# Patient Record
Sex: Male | Born: 1940 | Race: White | Hispanic: No | Marital: Married | Smoking: Never smoker
Health system: Southern US, Community
[De-identification: ages and names within clinical notes are randomized; demographics above are authoritative.]

## PROBLEM LIST (undated history)

## (undated) DIAGNOSIS — E785 Hyperlipidemia, unspecified: Secondary | ICD-10-CM

## (undated) DIAGNOSIS — N289 Disorder of kidney and ureter, unspecified: Secondary | ICD-10-CM

## (undated) HISTORY — PX: APPENDECTOMY: SHX54

## (undated) HISTORY — DX: Hyperlipidemia, unspecified: E78.5

## (undated) HISTORY — PX: TONSILLECTOMY: SUR1361

---

## 2001-11-08 ENCOUNTER — Ambulatory Visit (HOSPITAL_COMMUNITY): Admission: RE | Admit: 2001-11-08 | Discharge: 2001-11-08 | Payer: Self-pay | Admitting: *Deleted

## 2005-09-05 ENCOUNTER — Ambulatory Visit (HOSPITAL_COMMUNITY): Admission: RE | Admit: 2005-09-05 | Discharge: 2005-09-05 | Payer: Self-pay | Admitting: Ophthalmology

## 2008-08-17 ENCOUNTER — Inpatient Hospital Stay (HOSPITAL_COMMUNITY): Admission: AC | Admit: 2008-08-17 | Discharge: 2008-08-18 | Payer: Self-pay

## 2010-11-14 LAB — CBC
HCT: 46.8 % (ref 39.0–52.0)
Hemoglobin: 15.6 g/dL (ref 13.0–17.0)
MCHC: 33.3 g/dL (ref 30.0–36.0)
WBC: 8.4 10*3/uL (ref 4.0–10.5)

## 2010-11-14 LAB — POCT I-STAT, CHEM 8
Glucose, Bld: 98 mg/dL (ref 70–99)
HCT: 48 % (ref 39.0–52.0)
TCO2: 27 mmol/L (ref 0–100)

## 2010-11-14 LAB — PROTIME-INR: Prothrombin Time: 12.8 seconds (ref 11.6–15.2)

## 2010-12-13 NOTE — H&P (Signed)
NAMEMATTIAS, WALMSLEY                ACCOUNT NO.:  [id number]   MEDICAL RECORD NO.:  [id number]          PATIENT TYPE:  INP   LOCATION:  5016                         FACILITY:  [facility]   PHYSICIAN:  Sandria Bales. Ezzard Standing, M.D.  DATE OF BIRTH:  10/28/1940   DATE OF ADMISSION:  08/17/2008  DATE OF DISCHARGE:                              HISTORY & PHYSICAL   Date of admission ?   HISTORY OF ILLNESS:  This is a 70 year old white male who is a patient  of  Dr. Merri Brunette, who was cutting a tree limb this evening around 5  or 5:30 PM when the tree limb sprung back and hit him on the left side  of his face and knocked him out.  He was brought in by ambulance to the  [facility] emergency room as a Gold trauma but on presentation, he had a  Glasgow coma scale of 15 and a revised trauma score of 12, was alert,  oriented, and talking and downgraded to a Silver post evaluation.   PAST MEDICAL HISTORY:  He has no allergies.  His only medication, he is  using some Prevacid for gastroesophageal reflux disease.   His prior operations include a loss of his fifth finger on his right  hand in 1976.  He is right handed.  He had appendectomy at age 3.  He  had tonsils at age 56.   REVIEW OF SYSTEMS:  NEUROLOGIC:  No prior history of seizure or loss of  consciousness.  PULMONARY:  He does not smoke cigarettes.  Denies pneumonia,  tuberculosis.  CARDIAC:  He has had no heart disease, chest pain, or hypertension.  GASTROINTESTINAL:  He does have a history of reflux.  He has never  undergone endoscopy.  No history of liver disease, pancreatic disease,  and colon disease.  GENITOURINARY:  No kidney stones or kidney infections.   PERSONAL HISTORY:  He works for the [employer], was off today.  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   PHYSICAL EXAMINATION:  VITAL SIGNS:  He has a pulse of 75, blood  pressure of 125/71, and  respirations 16.  GENERAL:  He is alert, oriented.  He is a well-nourished pleasant white  male.  HEENT:  He has a laceration along his left side of the face, which is  about 8 cm.  Below this, between the lateral canthus of his left eye and  left ear, he has a stellate laceration about 4 cm.  He has extraocular movement good x6.  His pupils equal, reactive.  He  has gross vision in both eyes but he says he had right eye injury when  he was younger.  This left him with some decreased vision in the right  eye.  The left eye, he says, is in good shape before this injury.  He  has a cut on the underside of his left tongue probably where he bit it  some, but no lip or tooth injury.  NECK:  He is in cervical collar but has no clear neck pain.  He has no  abrasion in the base of his left neck.  He has abrasion of his left  shoulder, which has got some tenderness.  LUNGS:  Clear to auscultation with symmetric breath sounds.  HEART:  Regular rate and rhythm without murmur or rub.  ABDOMEN:  Soft.  He has no tenderness, no guarding, no abdominal injury.  His pelvis is stable.  We rolled  him on his back.  It is unremarkable  for any point pain or discomfort.  EXTREMITIES:  He is missing the fifth digit on his right hand.  He has  an abrasion over his right hand, probably where he caught something, and  an abrasion of his left shoulder.  He has got good strength on his upper  and lower extremities with appropriate neurologic evaluation.   His chest x-ray was negative.  We obtained a CT scan of the head, his  neck, and his face.  These showed no obvious fracture or intracranial  injury with no obvious facial fractures.   IMPRESSION:  1. Closed head injury with loss of consciousness.  He lives in [place],      which is at least some 30-40 minutes away, so I will plan to keep      him overnight for observation.  2. He has a 2 lacerations on the left side on his face approximately 8      cm and a  4 cm stellate laceration.  I will irrigate and clean this      in the emergency room.  3. History of gastroesophageal reflux disease.      Sandria Bales. Ezzard Standing, M.D.  Electronically Signed     DHN/MEDQ  D:  08/17/2008  T:  08/18/2008  Job:  [id number]   cc:   Soyla Murphy. Renne Crigler, M.D.

## 2010-12-13 NOTE — Op Note (Signed)
NAMEBEVERLEY, SHERRARD                ACCOUNT NO.:  [id number]   MEDICAL RECORD NO.:  [id number]          PATIENT TYPE:  EMS   LOCATION:  MAJO                         FACILITY:  [facility]   PHYSICIAN:  Sandria Bales. Ezzard Standing, M.D.  DATE OF BIRTH:  1940-10-28   DATE OF PROCEDURE:  DATE OF DISCHARGE:                               OPERATIVE REPORT   Date of surgery ?   PREOPERATIVE DIAGNOSES:  Two lacerations left temporal and cheek bone of  8 cm and a 4 cm left stylette laceration.   POSTOPERATIVE DIAGNOSES:  Laceration of left temporal cheek bone of 8 cm  and 4 cm stylette laceration.   PROCEDURE:  Closure of 2 lacerations with subcutaneous sutures and skin  sutures.   SURGEON:  Dr. Ezzard Standing.   ANESTHESIA:  Approximately 20 mL, 1% Xylocaine with epinephrine.   COMPLICATIONS:  None.   PROCEDURE:  Mr. Restivo is a 70 year old white male who was cutting a  limb.  The limb snapped back and hit him in the left temporal area and  knocked him out.  He was brought into the hospital as a Gold Trauma, but  then downgraded.  He has a significant laceration on the left side of  his face and left cheek.  From a neurologic standpoint, he has good  extraocular movements of the left eye.  He can close his eye well.  He  has a little bit of numbness associated around the edges of the  laceration and a CT scan of his face showed no facial fractures.   OPERATION:  I cleaned the area with Betadine.  First, I infiltrated about a 20 mL of  1% Xylocaine.  I palpated along the forehead temple area, there was no  bony fracture, I irrigated with a saline.  I saw very little debris in  the wound.  I cut back some of the dead skin.  I then closed it in 2  layers of the deep layer of 4-0 Vicryl suture and a skin layer of  interrupted 5-0 nylon sutures.   We washed with soap and water, painted with neosporin ointment.  The  patient tolerated the procedure well.  Will be observed overnight and  plan to discharge him  tomorrow morning.      Sandria Bales. Ezzard Standing, M.D.  Electronically Signed     DHN/MEDQ  D:  08/17/2008  T:  08/18/2008  Job:  [id number]

## 2012-12-12 ENCOUNTER — Other Ambulatory Visit: Payer: Self-pay | Admitting: Gastroenterology

## 2013-05-08 ENCOUNTER — Emergency Department (HOSPITAL_BASED_OUTPATIENT_CLINIC_OR_DEPARTMENT_OTHER)
Admission: EM | Admit: 2013-05-08 | Discharge: 2013-05-08 | Disposition: A | Discharge status: Home / Self Care | Payer: Worker's Compensation | Attending: Emergency Medicine | Admitting: Emergency Medicine

## 2013-05-08 ENCOUNTER — Encounter (HOSPITAL_BASED_OUTPATIENT_CLINIC_OR_DEPARTMENT_OTHER): Payer: Self-pay | Admitting: Emergency Medicine

## 2013-05-08 DIAGNOSIS — K4090 Unilateral inguinal hernia, without obstruction or gangrene, not specified as recurrent: Secondary | ICD-10-CM | POA: Insufficient documentation

## 2013-05-08 DIAGNOSIS — Z87448 Personal history of other diseases of urinary system: Secondary | ICD-10-CM | POA: Insufficient documentation

## 2013-05-08 HISTORY — DX: Disorder of kidney and ureter, unspecified: N28.9

## 2013-05-08 NOTE — ED Provider Notes (Signed)
CSN: [id number]     Arrival date & time 05/08/13  1551 History   First MD Initiated Contact with Patient 05/08/13 1653     Chief Complaint  Patient presents with  . Groin Injury   (Consider location/radiation/quality/duration/timing/severity/associated sxs/prior Treatment) HPI Comments: Pt presents with 2 days og R inguinal swelling he noticed while showering in setting of doing lots of pulling of elastic belts at work.  Mild pain over site, but non currently. Swelling worse w/ cough & standing. No scrotal pain/swelling. No hx of same. N n/v, d/a, fever, chills.   The history is provided by the patient. No language interpreter was used.    Past Medical History  Diagnosis Date  . Renal disorder    Past Surgical History  Procedure Laterality Date  . Tonsillectomy    . Appendectomy     No family history on file. History  Substance Use Topics  . Smoking status: Never Smoker   . Smokeless tobacco: Not on file  . Alcohol Use: No    Review of Systems  Constitutional: Negative for fever, activity change, appetite change and fatigue.  HENT: Negative for congestion, facial swelling, rhinorrhea and trouble swallowing.   Eyes: Negative for photophobia and pain.  Respiratory: Negative for cough, chest tightness and shortness of breath.   Cardiovascular: Negative for chest pain and leg swelling.  Gastrointestinal: Negative for nausea, vomiting, abdominal pain, diarrhea and constipation.  Endocrine: Negative for polydipsia and polyuria.  Genitourinary: Negative for dysuria, urgency, decreased urine volume and difficulty urinating.  Musculoskeletal: Negative for back pain and gait problem.  Skin: Negative for color change, rash and wound.  Allergic/Immunologic: Negative for immunocompromised state.  Neurological: Negative for dizziness, facial asymmetry, speech difficulty, weakness, numbness and headaches.  Psychiatric/Behavioral: Negative for confusion, decreased concentration and  agitation.    Allergies  Review of patient's allergies indicates no known allergies.  Home Medications   Current Outpatient Rx  Name  Route  Sig  Dispense  Refill  . tamsulosin (FLOMAX) 0.4 MG CAPS capsule   Oral   Take 0.4 mg by mouth.          BP 107/65  Pulse 66  Temp(Src) 98.7 F (37.1 C) (Oral)  Resp 18  SpO2 99% Physical Exam  Constitutional: He is oriented to person, place, and time. He appears well-developed and well-nourished. No distress.  HENT:  Head: Normocephalic and atraumatic.  Mouth/Throat: No oropharyngeal exudate.  Eyes: Pupils are equal, round, and reactive to light.  Neck: Normal range of motion. Neck supple.  Cardiovascular: Normal rate, regular rhythm and normal heart sounds.  Exam reveals no gallop and no friction rub.   No murmur heard. Pulmonary/Chest: Effort normal and breath sounds normal. No respiratory distress. He has no wheezes. He has no rales.  Abdominal: Soft. Bowel sounds are normal. He exhibits no distension and no mass. There is no tenderness. There is no rebound and no guarding. A hernia is present. Hernia confirmed positive in the right inguinal area. Hernia confirmed negative in the left inguinal area.  Genitourinary: Testes normal. Cremasteric reflex is present. Right testis shows no mass, no swelling and no tenderness. Left testis shows no mass, no swelling and no tenderness.  Musculoskeletal: Normal range of motion. He exhibits no edema and no tenderness.  Lymphadenopathy:       Right: No inguinal adenopathy present.       Left: No inguinal adenopathy present.  Neurological: He is alert and oriented to person, place, and time.  Skin:  Skin is warm and dry.  Psychiatric: He has a normal mood and affect.    ED Course  Procedures (including critical care time) Labs Review Labs Reviewed - No data to display Imaging Review No results found.  EKG Interpretation   None       MDM   1. Indirect inguinal hernia    Pt is a 72  y.o. male with Pmhx as above who presents with 3 days of R inguinal swelling worse w/ cough & standing that he found while showering. Exam c/w indirect inquinal hernia.  Scrotal/testicular exam otherwise benign.  No ttp over hernia, and doubt strangulation.  Will have him f/u with PCP tomorrow and have given referral to [facility] [place] [facility]. Return precautions given for new or worsening symptoms including persisent pain/swelling not improved by pressure or laying down.          Shanna Cisco, MD 05/08/13 1743

## 2013-05-08 NOTE — ED Notes (Signed)
Right groin injury. Stretching a belt 3 days ago. He felt pain the next morning. Workmans comp.

## 2013-06-03 ENCOUNTER — Telehealth (HOSPITAL_BASED_OUTPATIENT_CLINIC_OR_DEPARTMENT_OTHER): Payer: Self-pay | Admitting: *Deleted

## 2013-06-03 NOTE — ED Notes (Signed)
Daniel Dunn called and requested a referral to [name].  Chart reviewed with Daniel Cobb, Daniel Dunn.  Call placed to Daniel Dunn and left message with referral on referral line. Message left for Daniel Dunn to call back if he needs additional information.

## 2013-06-05 NOTE — ED Notes (Signed)
Mr. Mcnay called back with correct fax # for [facility].  Faxed order for referral and referral information.  Fax :[phone] Chart reviewed with K. Sophia, PAC

## 2019-08-19 ENCOUNTER — Ambulatory Visit: Payer: POS | Attending: Internal Medicine

## 2019-08-19 DIAGNOSIS — Z20822 Contact with and (suspected) exposure to covid-19: Secondary | ICD-10-CM | POA: Insufficient documentation

## 2019-08-20 LAB — NOVEL CORONAVIRUS, NAA: SARS-CoV-2, NAA: NOT DETECTED

## 2019-08-22 ENCOUNTER — Ambulatory Visit: Payer: Self-pay | Attending: Internal Medicine

## 2019-08-22 DIAGNOSIS — Z23 Encounter for immunization: Secondary | ICD-10-CM | POA: Insufficient documentation

## 2019-08-22 NOTE — Progress Notes (Signed)
   Covid-19 Vaccination Clinic  Name:  Antwine Agosto    MRN: [id number] DOB: September 07, 1940  08/22/2019  Mr. Huster was observed post Covid-19 immunization for 15 minutes without incidence. He was provided with Vaccine Information Sheet and instruction to access the V-Safe system.   Mr. Wojcicki was instructed to call 911 with any severe reactions post vaccine: Marland Kitchen Difficulty breathing  . Swelling of your face and throat  . A fast heartbeat  . A bad rash all over your body  . Dizziness and weakness    Immunizations Administered    Name Date Dose VIS Date Route   Pfizer COVID-19 Vaccine 08/22/2019  4:18 PM 0.3 mL 07/11/2019 Intramuscular   Manufacturer: [employer], [employer]   Lot: [id number]   NDC: [id number]

## 2019-09-12 ENCOUNTER — Ambulatory Visit: Payer: POS | Attending: Internal Medicine

## 2019-09-12 DIAGNOSIS — Z23 Encounter for immunization: Secondary | ICD-10-CM

## 2019-09-12 NOTE — Progress Notes (Signed)
   Covid-19 Vaccination Clinic  Name:  SANG BLOUNT    MRN: [id number] DOB: [birth year]  09/12/2019  Mr. Wimberly was observed post Covid-19 immunization for 15 minutes without incidence. He was provided with Vaccine Information Sheet and instruction to access the V-Safe system.   Mr. Bekele was instructed to call 911 with any severe reactions post vaccine: Marland Kitchen Difficulty breathing  . Swelling of your face and throat  . A fast heartbeat  . A bad rash all over your body  . Dizziness and weakness    Immunizations Administered    Name Date Dose VIS Date Route   Pfizer COVID-19 Vaccine 09/12/2019  3:26 PM 0.3 mL 07/11/2019 Intramuscular   Manufacturer: [employer], [employer]   Lot: [id number]   NDC: [address]-6

## 2020-04-15 ENCOUNTER — Other Ambulatory Visit: Payer: Self-pay | Admitting: Internal Medicine

## 2020-04-15 DIAGNOSIS — R911 Solitary pulmonary nodule: Secondary | ICD-10-CM

## 2020-04-26 ENCOUNTER — Other Ambulatory Visit: Payer: POS

## 2020-04-26 ENCOUNTER — Ambulatory Visit
Admission: RE | Admit: 2020-04-26 | Discharge: 2020-04-26 | Disposition: A | Discharge status: Home / Self Care | Payer: POS | Source: Ambulatory Visit | Attending: Internal Medicine | Admitting: Internal Medicine

## 2020-04-26 DIAGNOSIS — R911 Solitary pulmonary nodule: Secondary | ICD-10-CM

## 2020-06-02 ENCOUNTER — Other Ambulatory Visit: Payer: Self-pay | Admitting: Internal Medicine

## 2020-06-02 DIAGNOSIS — J189 Pneumonia, unspecified organism: Secondary | ICD-10-CM

## 2020-07-12 ENCOUNTER — Ambulatory Visit
Admission: RE | Admit: 2020-07-12 | Discharge: 2020-07-12 | Disposition: A | Discharge status: Home / Self Care | Payer: POS | Source: Ambulatory Visit | Attending: Internal Medicine | Admitting: Internal Medicine

## 2020-07-12 DIAGNOSIS — J189 Pneumonia, unspecified organism: Secondary | ICD-10-CM

## 2020-08-09 ENCOUNTER — Encounter: Payer: Self-pay | Admitting: Emergency Medicine

## 2020-08-09 ENCOUNTER — Ambulatory Visit (INDEPENDENT_AMBULATORY_CARE_PROVIDER_SITE_OTHER): Payer: POS | Admitting: Emergency Medicine

## 2020-08-09 ENCOUNTER — Other Ambulatory Visit: Payer: Self-pay

## 2020-08-09 VITALS — BP 120/60 | HR 93 | Temp 97.4°F | Ht 71.5 in | Wt 193.6 lb

## 2020-08-09 DIAGNOSIS — R053 Chronic cough: Secondary | ICD-10-CM | POA: Insufficient documentation

## 2020-08-09 DIAGNOSIS — R9389 Abnormal findings on diagnostic imaging of other specified body structures: Secondary | ICD-10-CM

## 2020-08-09 DIAGNOSIS — R911 Solitary pulmonary nodule: Secondary | ICD-10-CM | POA: Diagnosis not present

## 2020-08-09 HISTORY — DX: Chronic cough: R05.3

## 2020-08-09 HISTORY — DX: Abnormal findings on diagnostic imaging of other specified body structures: R93.89

## 2020-08-09 NOTE — Patient Instructions (Signed)
Please continue cetirizine (Zyrtec) once daily. Restart your fluticasone nasal spray (Flonase) 2 sprays each nostril once daily. We will plan to repeat your CT scan of the chest in March 2023 to follow for interval stability. Review with Dr. Renne Crigler whether you have received the pneumonia vaccine in the past Please notify our office if you have any recurrence of coughing, any shortness of breath or other respiratory symptoms.  If so we will see you sooner. Follow with Dr. Delton Coombes in March 2023 after your CT scan or sooner if you have any problems.

## 2020-08-09 NOTE — Progress Notes (Signed)
Subjective:    Patient ID: Daniel Dunn, male    DOB: [birth year], 80 y.o.   MRN: [id number]  HPI 80 year old never smoker with a history of hyperlipidemia, colonic polyps, allergic rhinitis, vitamin D deficiency, aortic atherosclerosis.  He was experiencing sore throat, cough with mucous Spring '21 through December. Then CT abd done for some hematuria early September, showed possible nodular disease so prompted CT chest as below. The CT chest questioned PNA, so he received azithro >> cough and sore throat improved. But he is also on benzonatate. He has PND, no GERD. Has zyrtec, and flonase, doesn;t use reliably.   CT scan of the chest 04/26/2020 reviewed by me, shows subcentimeter mediastinal nodes, nonpathologic, some apical bilateral scarring some posterior right upper lobe areas of nodularity, 4 x 3 mm nodular opacity posterior segment left upper lobe, 5 x 3 mm area of nodularity in the lingula with some subtle tree-in-bud type basilar opacities with some associated atelectasis  CT scan of the chest 07/12/2020 reviewed by me shows interval development of subtle tree-in-bud anterior lateral lingular opacity, minimal residual right basilar groundglass (improved), stable posterior right upper lobe nodular opacities, left upper lobe opacity.   Review of Systems As per HPI   Past Medical History:  Diagnosis Date  . Renal disorder   Hyperlipidemia Adenomatous polyps of the colon Allergic rhinitis, vitamin D deficiency Aortic atherosclerosis   No family history on file.   Social History   Socioeconomic History  . Marital status: Married    Spouse name: Not on file  . Number of children: Not on file  . Years of education: Not on file  . Highest education level: Not on file  Occupational History  . Not on file  Tobacco Use  . Smoking status: Never Smoker  . Smokeless tobacco: Never Used  Substance and Sexual Activity  . Alcohol use: No  . Drug use: No  . Sexual activity: Not  on file  Other Topics Concern  . Not on file  Social History Narrative  . 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  Was in [place], some agent orange.  Has lived in [place], [place], [place] Worked for [employer]  No Known Allergies   Outpatient Medications Prior to Visit  Medication Sig Dispense Refill  . aspirin 81 MG EC tablet 1 tablet    . atorvastatin (LIPITOR) 20 MG tablet Take 20 mg by mouth daily.    Marland Kitchen augmented betamethasone dipropionate (DIPROLENE-AF) 0.05 % cream Apply topically.    . benzonatate (TESSALON) 100 MG capsule Take 100 mg by mouth 3 (three) times daily.    . cetirizine (ZYRTEC ALLERGY) 10 MG tablet Take 10 mg by mouth daily.    . Cholecalciferol (VITAMIN D3) 50 MCG (2000 UT) CAPS Take 2 capsules by mouth daily.    Marland Kitchen ibuprofen (ADVIL) 600 MG tablet Take 600 mg by mouth 3 (three) times daily.    Marland Kitchen ketoconazole (NIZORAL) 2 % cream SMARTSIG:1 Application Topical 1 to 2 Times Daily    . magnesium oxide (MAG-OX) 400 MG tablet Take 400 mg by mouth daily.    . tamsulosin (FLOMAX) 0.4 MG CAPS capsule  Take 0.4 mg by mouth.     No facility-administered medications prior to visit.         Objective:   Physical Exam Vitals:   08/09/20 1514  BP: 120/60  Pulse: 93  Temp: (!) 97.4 F (36.3 C)  TempSrc: Temporal  SpO2: 96%  Weight: 193 lb 9.6 oz (87.8 kg)  Height: 5' 11.5" (1.816 m)   Gen: Pleasant, well-nourished, in no distress,  normal affect  ENT: No lesions,  mouth clear,  oropharynx clear, no postnasal drip  Neck: No JVD, no stridor  Lungs: No use of accessory muscles, no crackles or wheezing on normal respiration, no wheeze on forced expiration  Cardiovascular: RRR, heart  sounds normal, no murmur or gallops, no peripheral edema  Musculoskeletal: No deformities, no cyanosis or clubbing  Neuro: alert, awake, non focal  Skin: Warm, no lesions or rash      Assessment & Plan:  Abnormal CT of the chest CT chest was prompted by some subtle micronodular groundglass change on CT abdomen in September.  There was some lingular tree-in-bud opacity with some scattered more visible nodular disease bilaterally, etiology unclear.  No evidence for overt consolidation or community-acquired pneumonia, question atypical infection.  He was treated with azithromycin and his cough did get better although unclear whether the cough and the CT findings correlate.  His repeat CT in December shows migratory tree-in-bud inflammatory change in the lingula, improvement in his right basilar groundglass change and stable small nodules.  The groundglass changes could reflect a bronchiolitis or atypical mycobacterial infection.  If he develops recurrent cough, symptoms then it would be worthwhile to repeat his imaging, look for evidence of micronodular disease and consider bronchoscopy for culture data.  Absent any recurrent cough then I would repeat his CT at the 42-month mark which would be March 2023 to follow the tree-in-bud abnormalities and also the nodules for stability.  He is a low risk patient, never smoker.  Chronic cough Question whether this was related to postnasal drip, he denies any overt GERD symptoms.  Interestingly he did start to improve in December after he was treated with azithromycin but notes that his nasal congestion was improving at the same time and he is also on benzonatate.  I think he needs more consistent treatment of allergic rhinitis.  He will start taking his cetirizine more reliably, start using his fluticasone nasal spray.  If his cough persist then we could consider empiric treatment of esophageal reflux   Levy Pupa, MD, PhD 08/09/2020, 5:33 PM [place]  [facility] [phone] or if no answer [phone]

## 2020-08-09 NOTE — Assessment & Plan Note (Signed)
CT chest was prompted by some subtle micronodular groundglass change on CT abdomen in September.  There was some lingular tree-in-bud opacity with some scattered more visible nodular disease bilaterally, etiology unclear.  No evidence for overt consolidation or community-acquired pneumonia, question atypical infection.  He was treated with azithromycin and his cough did get better although unclear whether the cough and the CT findings correlate.  His repeat CT in December shows migratory tree-in-bud inflammatory change in the lingula, improvement in his right basilar groundglass change and stable small nodules.  The groundglass changes could reflect a bronchiolitis or atypical mycobacterial infection.  If he develops recurrent cough, symptoms then it would be worthwhile to repeat his imaging, look for evidence of micronodular disease and consider bronchoscopy for culture data.  Absent any recurrent cough then I would repeat his CT at the 109-month mark which would be March 2023 to follow the tree-in-bud abnormalities and also the nodules for stability.  He is a low risk patient, never smoker.

## 2020-08-09 NOTE — Assessment & Plan Note (Signed)
Question whether this was related to postnasal drip, he denies any overt GERD symptoms.  Interestingly he did start to improve in December after he was treated with azithromycin but notes that his nasal congestion was improving at the same time and he is also on benzonatate.  I think he needs more consistent treatment of allergic rhinitis.  He will start taking his cetirizine more reliably, start using his fluticasone nasal spray.  If his cough persist then we could consider empiric treatment of esophageal reflux

## 2021-03-17 ENCOUNTER — Telehealth: Payer: Self-pay | Admitting: Emergency Medicine

## 2021-03-18 NOTE — Telephone Encounter (Signed)
Ct is not due until March 2023 per Dr. Kavin Leech note from 07/2020

## 2021-03-18 NOTE — Telephone Encounter (Signed)
Spoke to wife she is aware ct and rov is to be done in 3/23 and we will call to set this up Tobe Sos

## 2021-04-06 ENCOUNTER — Telehealth: Payer: Self-pay | Admitting: Emergency Medicine

## 2021-04-06 MED ORDER — AZITHROMYCIN 250 MG PO TABS: 250.0000 mg | ORAL_TABLET | ORAL | 0 refills | Status: DC

## 2021-04-06 MED ORDER — BENZONATATE 100 MG PO CAPS: 100.0000 mg | ORAL_CAPSULE | Freq: Three times a day (TID) | ORAL | 0 refills | Status: DC | PRN

## 2021-04-06 NOTE — Telephone Encounter (Signed)
ATC, left VM. 

## 2021-04-06 NOTE — Telephone Encounter (Signed)
Spoke with the pt and notified of recs per RB  Rxs sent and nothing further needed

## 2021-04-06 NOTE — Telephone Encounter (Signed)
Called and spoke with patient's wife who states that patients cough returned in July/August and that patient has a lot of mucus draining down back of throat. She states patient had Covid August 12th. Patient is taking Zyrtec in the morning and Benedryl at night to help dry it up. Patient's cough is sometimes productive with clear sputum. Denies fever.   Dr. Delton Coombes please advise

## 2021-04-06 NOTE — Telephone Encounter (Signed)
If he is not currently taking the fluticasone nasal spray can have him get back on this.  Continue Zyrtec and Benadryl. He is use Tessalon Perles in the past and we can refill this if he thinks he would benefit. Have him take azithromycin Z-Pak.  This has helped him in the past as well. If he continues to have cough he needs to come in to see either RB or APP

## 2021-04-06 NOTE — Telephone Encounter (Signed)
ATC, VM was full, could not leave VM

## 2021-06-29 ENCOUNTER — Encounter (HOSPITAL_COMMUNITY): Payer: Self-pay | Admitting: Emergency Medicine

## 2021-06-29 ENCOUNTER — Emergency Department (HOSPITAL_COMMUNITY)
Admission: EM | Admit: 2021-06-29 | Discharge: 2021-06-29 | Disposition: A | Discharge status: Home / Self Care | Payer: POS | Attending: Emergency Medicine | Admitting: Emergency Medicine

## 2021-06-29 ENCOUNTER — Emergency Department (HOSPITAL_COMMUNITY): Payer: POS

## 2021-06-29 ENCOUNTER — Other Ambulatory Visit: Payer: Self-pay

## 2021-06-29 DIAGNOSIS — U071 COVID-19: Secondary | ICD-10-CM | POA: Diagnosis not present

## 2021-06-29 DIAGNOSIS — R55 Syncope and collapse: Secondary | ICD-10-CM

## 2021-06-29 DIAGNOSIS — Z7982 Long term (current) use of aspirin: Secondary | ICD-10-CM | POA: Diagnosis not present

## 2021-06-29 LAB — BASIC METABOLIC PANEL
Anion gap: 8 (ref 5–15)
BUN: 11 mg/dL (ref 8–23)
CO2: 26 mmol/L (ref 22–32)
Calcium: 8.7 mg/dL — ABNORMAL LOW (ref 8.9–10.3)
Chloride: 105 mmol/L (ref 98–111)
Creatinine, Ser: 0.9 mg/dL (ref 0.61–1.24)
GFR, Estimated: >60 mL/min (ref >60)
Glucose, Bld: 108 mg/dL — ABNORMAL HIGH (ref 70–99)
Potassium: 4.3 mmol/L (ref 3.5–5.1)
Sodium: 139 mmol/L (ref 135–145)

## 2021-06-29 LAB — CBC WITH DIFFERENTIAL/PLATELET
Abs Immature Granulocytes: 0.03 10*3/uL (ref 0.00–0.07)
Basophils Absolute: 0 10*3/uL (ref 0.0–0.1)
Basophils Relative: 0 %
Eosinophils Absolute: 0.3 10*3/uL (ref 0.0–0.5)
Eosinophils Relative: 5 %
HCT: 49.5 % (ref 39.0–52.0)
Hemoglobin: 16.3 g/dL (ref 13.0–17.0)
Immature Granulocytes: 0 %
Lymphocytes Relative: 15 %
Lymphs Abs: 1 10*3/uL (ref 0.7–4.0)
MCH: 30.5 pg (ref 26.0–34.0)
MCHC: 32.9 g/dL (ref 30.0–36.0)
MCV: 92.5 fL (ref 80.0–100.0)
Monocytes Absolute: 1 10*3/uL (ref 0.1–1.0)
Monocytes Relative: 14 %
Neutro Abs: 4.6 10*3/uL (ref 1.7–7.7)
Neutrophils Relative %: 66 %
Platelets: 240 10*3/uL (ref 150–400)
RBC: 5.35 MIL/uL (ref 4.22–5.81)
RDW: 13.5 % (ref 11.5–15.5)
WBC: 7 10*3/uL (ref 4.0–10.5)
nRBC: 0 % (ref 0.0–0.2)

## 2021-06-29 LAB — URINALYSIS, ROUTINE W REFLEX MICROSCOPIC
Bilirubin Urine: NEGATIVE
Glucose, UA: NEGATIVE mg/dL
Ketones, ur: NEGATIVE mg/dL
Leukocytes,Ua: NEGATIVE
Nitrite: NEGATIVE
Protein, ur: NEGATIVE mg/dL
Specific Gravity, Urine: 1.02 (ref 1.005–1.030)
pH: 6 (ref 5.0–8.0)

## 2021-06-29 LAB — RESP PANEL BY RT-PCR (FLU A&B, COVID) ARPGX2
Influenza A by PCR: NEGATIVE
Influenza B by PCR: NEGATIVE
SARS Coronavirus 2 by RT PCR: POSITIVE — AB

## 2021-06-29 LAB — URINALYSIS, MICROSCOPIC (REFLEX): Bacteria, UA: NONE SEEN

## 2021-06-29 LAB — CBG MONITORING, ED: Glucose-Capillary: 130 mg/dL — ABNORMAL HIGH (ref 70–99)

## 2021-06-29 LAB — TROPONIN I (HIGH SENSITIVITY)
Troponin I (High Sensitivity): 6 ng/L (ref <18)
Troponin I (High Sensitivity): 5 ng/L (ref <18)

## 2021-06-29 MED ORDER — SODIUM CHLORIDE 0.9 % IV BOLUS
1000.0000 mL | Freq: Once | INTRAVENOUS | Status: AC
  Administered 2021-06-29: 1000 mL via INTRAVENOUS

## 2021-06-29 MED ORDER — NIRMATRELVIR/RITONAVIR (PAXLOVID)TABLET: 3.0000 | ORAL_TABLET | Freq: Two times a day (BID) | ORAL | 0 refills | Status: AC

## 2021-06-29 NOTE — ED Notes (Signed)
Assumed care of this patient at this time.  Pt to room 26

## 2021-06-29 NOTE — Discharge Instructions (Addendum)
You were seen today for evaluation of your passing out spells. Your work-up was very reassuring for acute abnormalities. Feel that your symptoms are likely due to COVID vs dehydration. Please ensure that you are staying adequately hydrated and are careful with transferring from sitting to standing.  Additionally, given that you are somewhat higher risk I have given you Paxil event which is our antiviral medication for COVID.  Please take this as prescribed.  You will need to follow-up with your primary care in the next few days for further evaluation and management.  As always you may return to the emergency department for any new or worsening symptoms.

## 2021-06-29 NOTE — ED Provider Notes (Signed)
Emergency Medicine Provider Triage Evaluation Note  Daniel Dunn , a 80 y.o. male  was evaluated in triage.  Pt complains of syncope.  Was feeling dizzy yesterday.  Had some chest tightness yesterday which resolved.  Noted today he has had 3 syncopal episodes.  First occurred when he went to sit up in bed.  Felt lightheaded, EMS states wife told him he was unconscious for about 2 minutes.  No urinary incontinence, tongue biting, shaking. Went to stand to go to the bathroom and had 2 additional syncopal episodes hit his head on the ground, per EMS.  He has no current chest pain, shortness of breath, paresthesias or unilateral weakness.  EMS states patient with A. fib without RVR on EKG, no history of similar.  No abdominal pain.  Feels generally weak.  Has chronic cough.  No history of PE, DVT.  Hypotensive with EMS into the 80s systolic, got 500 cc which improved BP to 110 systolic  Review of Systems  Positive: Syncope, weakness, chest tightness (resolved) Negative: Back pain, abdominal pain, numbness  Physical Exam  BP 113/68 (BP Location: Left Arm)   Pulse 73   Temp 98.3 F (36.8 C) (Oral)   Resp 18   SpO2 100%  Gen:   Awake, no distress   Resp:  Normal effort  MSK:   Moves extremities without difficulty, No midline C/T/L tenderness, non tender Bl LE Other:    Medical Decision Making  Medically screening exam initiated at 8:22 AM.  Appropriate orders placed.  Aaliyah R Bergen was informed that the remainder of the evaluation will be completed by another provider, this initial triage assessment does not replace that evaluation, and the importance of remaining in the ED until their evaluation is complete.  Syncope, BP 115 systolic.  Feels generally weak however is nonfocal neuro exam.  No current chest pain or shortness of breath   Naz Denunzio A, PA-C 06/29/21 0823    Virgina Norfolk, DO 06/29/21 [phone]

## 2021-06-29 NOTE — ED Provider Notes (Signed)
[facility] EMERGENCY DEPARTMENT Provider Note   CSN: [id number] Arrival date & time: 06/29/21  1324    History Syncope  Daniel Dunn is a 80 y.o. male with no significant past medical history who presents for evaluation of syncope.  States he went to stand up yesterday lightheaded, dizzy, palpitations. This resolved when he sat down.  States he has had something similar in the past. States today when he got up to use the restroom> he had stood up, walked almost to the restroom and had syncopal episode.  Wife states patient was "out of it" for approximately 2 minutes.  Did hit the back end of his head on the ground.  She was unable to get him up off the ground. Called EMS. He denies any fever, chills, headache, paresthesias, unilateral weakness, chest pain, shortness of breath.  Patient denies any current complaints.  No fever, cough, congestion, abdominal pain, diarrhea, dysuria.  Denies additional aggravating or alleviating factors.  Wife does state that patient gets dizzy episodes when he goes from sitting and standing quite frequently.  She states his blood pressure is "low" to begin with.  Patient states his systolic is typically "110" at baseline.  Denies additional aggravating or relieving factors  Per wife patient has been generally weak over the last few days.  History obtained from patient, wife and past medical records.  No interpreter used.  HPI     Past Medical History:  Diagnosis Date   Renal disorder     Patient Active Problem List   Diagnosis Date Noted   Abnormal CT of the chest 08/09/2020   Chronic cough 08/09/2020    Past Surgical History:  Procedure Laterality Date   APPENDECTOMY     TONSILLECTOMY         No family history on file.  Social History   Tobacco Use   Smoking status: Never   Smokeless tobacco: Never  Substance Use Topics   Alcohol use: No   Drug use: No    Home Medications Prior to Admission medications    Medication Sig Start Date End Date Taking? Authorizing Provider  aspirin 81 MG EC tablet 1 tablet 04/27/20   [provider]  atorvastatin (LIPITOR) 20 MG tablet Take 20 mg by mouth daily. 04/27/20   [provider]  augmented betamethasone dipropionate (DIPROLENE-AF) 0.05 % cream Apply topically. 04/13/20   [provider]  azithromycin (ZITHROMAX) 250 MG tablet Take 1 tablet (250 mg total) by mouth as directed. 04/06/21   Leslye Peer, MD  benzonatate (TESSALON) 100 MG capsule Take 1 capsule (100 mg total) by mouth 3 (three) times daily as needed for cough. 04/06/21   Leslye Peer, MD  cetirizine (ZYRTEC ALLERGY) 10 MG tablet Take 10 mg by mouth daily.    [provider]  Cholecalciferol (VITAMIN D3) 50 MCG (2000 UT) CAPS Take 2 capsules by mouth daily.    [provider]  ibuprofen (ADVIL) 600 MG tablet Take 600 mg by mouth 3 (three) times daily.    [provider]  ketoconazole (NIZORAL) 2 % cream SMARTSIG:1 Application Topical 1 to 2 Times Daily 04/13/20   [provider]  magnesium oxide (MAG-OX) 400 MG tablet Take 400 mg by mouth daily.    [provider]  tamsulosin (FLOMAX) 0.4 MG CAPS capsule Take 0.4 mg by mouth.    [provider]    Allergies    Patient has no known allergies.  Review of Systems  Review of Systems  Constitutional: Negative.   HENT: Negative.    Respiratory: Negative.    Cardiovascular:  Positive for palpitations (with standing yesterday, resolved). Negative for chest pain and leg swelling.  Gastrointestinal: Negative.   Genitourinary: Negative.   Musculoskeletal: Negative.   Skin: Negative.   Neurological:  Positive for weakness (Generalized) and light-headedness. Negative for dizziness, tremors, syncope, facial asymmetry, speech difficulty, numbness and headaches.  Psychiatric/Behavioral: Negative.    All other systems reviewed and are negative.  Physical Exam Updated Vital  Signs BP 106/65   Pulse 75   Temp 98.3 F (36.8 C) (Oral)   Resp 17   Ht 5' 11.5" (1.816 m)   Wt 88 kg   SpO2 99%   BMI 26.68 kg/m   Physical Exam Physical Exam  Constitutional: Pt is oriented to person, place, and time. Pt appears well-developed and well-nourished. No distress.  HENT:  Head: Normocephalic and atraumatic.  Mouth/Throat: Oropharynx is clear and moist.  Eyes: Conjunctivae and EOM are normal. Pupils are equal, round, and reactive to light. No scleral icterus.  No horizontal, vertical or rotational nystagmus  Neck: Normal range of motion. Neck supple.  Full active and passive ROM without pain No midline or paraspinal tenderness No nuchal rigidity or meningeal signs  Cardiovascular: Normal rate, regular rhythm and intact distal pulses.   Pulmonary/Chest: Effort normal and breath sounds normal. No respiratory distress. Pt has no wheezes. No rales.  Abdominal: Soft. Bowel sounds are normal. There is no tenderness. There is no rebound and no guarding.  Musculoskeletal: Normal range of motion.  Lymphadenopathy:    No cervical adenopathy.  Neurological: Pt. is alert and oriented to person, place, and time. He has normal reflexes. No cranial nerve deficit.  Exhibits normal muscle tone. Coordination normal.  Mental Status:  Alert, oriented, thought content appropriate. Speech fluent without evidence of aphasia. Able to follow 2 step commands without difficulty.  Cranial Nerves:  II:  Peripheral visual fields grossly normal, pupils equal, round, reactive to light III,IV, VI: ptosis not present, extra-ocular motions intact bilaterally  V,VII: smile symmetric, facial light touch sensation equal VIII: hearing grossly normal bilaterally  IX,X: midline uvula rise  XI: bilateral shoulder shrug equal and strong XII: midline tongue extension  Motor:  5/5 in upper and lower extremities bilaterally including strong and equal grip strength and dorsiflexion/plantar flexion Sensory:  Pinprick and light touch normal in all extremities.  Deep Tendon Reflexes: 2+ and symmetric  Cerebellar: normal finger-to-nose with bilateral upper extremities Gait: normal gait and balance CV: distal pulses palpable throughout   Skin: Skin is warm and dry. No rash noted. Pt is not diaphoretic.  Psychiatric: Pt has a normal mood and affect. Behavior is normal. Judgment and thought content normal.  Nursing note and vitals reviewed.  ED Results / Procedures / Treatments   Labs (all labs ordered are listed, but only abnormal results are displayed) Labs Reviewed  RESP PANEL BY RT-PCR (FLU A&B, COVID) ARPGX2 - Abnormal; Notable for the following components:      Result Value   SARS Coronavirus 2 by RT PCR POSITIVE (*)    All other components within normal limits  BASIC METABOLIC PANEL - Abnormal; Notable for the following components:   Glucose, Bld 108 (*)    Calcium 8.7 (*)    All other components within normal limits  CBC WITH DIFFERENTIAL/PLATELET  URINALYSIS, ROUTINE W REFLEX MICROSCOPIC  TROPONIN I (HIGH SENSITIVITY)  TROPONIN I (HIGH SENSITIVITY)    EKG None  Radiology DG Chest 2 View  Result Date: 06/29/2021 CLINICAL DATA:  Syncope, chest pain EXAM: CHEST - 2 VIEW COMPARISON:  Chest radiograph 08/17/2008 FINDINGS: The cardiomediastinal silhouette is normal. The lungs are clear, with no focal consolidation or pulmonary edema. There is no pleural effusion or pneumothorax. There is no acute osseous abnormality. IMPRESSION: No radiographic evidence of acute cardiopulmonary process. Electronically Signed   By: Lesia Hausen M.D.   On: 06/29/2021 08:46   DG Pelvis 1-2 Views  Result Date: 06/29/2021 CLINICAL DATA:  Dizzy spell, syncope, fall EXAM: PELVIS - 1-2 VIEW COMPARISON:  None. FINDINGS: No acute fracture or dislocation. No aggressive osseous lesion. Normal alignment. Generalized osteopenia. 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 Soft tissue are unremarkable. No radiopaque foreign body or soft tissue emphysema. IMPRESSION: No acute osseous injury of the pelvis. Electronically Signed   By: Elige Ko M.D.   On: 06/29/2021 08:44   CT HEAD WO CONTRAST ( )  Result Date: 06/29/2021 CLINICAL DATA:  Head trauma, moderate to severe EXAM: CT HEAD WITHOUT CONTRAST CT CERVICAL SPINE WITHOUT CONTRAST TECHNIQUE: Multidetector CT imaging of the head and cervical spine was performed following the standard protocol without intravenous contrast. Multiplanar CT image reconstructions of the cervical spine were also generated. COMPARISON:  None. FINDINGS: CT HEAD FINDINGS Brain: Mild age related volume loss. Minimal small vessel change of the cerebral hemispheric white matter. No sign of acute infarction, mass lesion, hemorrhage, hydrocephalus or extra-axial collection. Vascular: There is atherosclerotic calcification of the major vessels at the base of the brain. Skull: No skull fracture. Sinuses/Orbits: Clear/normal Other: None CT CERVICAL SPINE FINDINGS Alignment: No malalignment. Skull base and vertebrae: No fracture or focal bone lesion. Soft tissues and spinal canal: No soft tissue injury is seen. Disc levels: Mild degenerative spondylosis C3-4 and C6-7. 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 No significant stenosis. Upper chest: Negative Other: None IMPRESSION: Head CT: No acute or traumatic finding. Minimal age related volume loss and small-vessel change of the white matter. Cervical spine CT: No acute or traumatic finding. Ordinary chronic spondylosis and facet osteoarthritis. Electronically Signed   By: Paulina Fusi M.D.   On: 06/29/2021 09:34   CT Cervical Spine Wo Contrast  Result Date: 06/29/2021 CLINICAL DATA:  Head trauma, moderate to severe EXAM: CT HEAD WITHOUT CONTRAST CT CERVICAL SPINE WITHOUT CONTRAST TECHNIQUE: Multidetector CT imaging of the head and cervical spine was  performed following the standard protocol without intravenous contrast. Multiplanar CT image reconstructions of the cervical spine were also generated. COMPARISON:  None. FINDINGS: CT HEAD FINDINGS Brain: Mild age related volume loss. Minimal small vessel change of the cerebral hemispheric white matter. No sign of acute infarction, mass lesion, hemorrhage, hydrocephalus or extra-axial collection. Vascular: There is atherosclerotic calcification of the major vessels at the base of the brain. Skull: No skull fracture. Sinuses/Orbits: Clear/normal Other: None CT CERVICAL SPINE FINDINGS Alignment: No malalignment. Skull base and vertebrae: No fracture or focal bone lesion. Soft tissues and spinal canal: No soft tissue injury is seen. Disc levels: Mild degenerative spondylosis C3-4 and C6-7. 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 No significant stenosis. Upper chest: Negative Other: None IMPRESSION: Head CT: No acute or traumatic finding. Minimal age related volume loss and small-vessel change of the white matter. Cervical spine CT: No acute or traumatic finding. Ordinary chronic spondylosis and facet osteoarthritis. Electronically Signed   By: Paulina Fusi M.D.   On:  06/29/2021 09:34    Procedures Procedures   Medications Ordered in ED Medications  sodium chloride 0.9 % bolus 1,000 mL (1,000 mLs Intravenous New Bag/Given 06/29/21 1437)    ED Course  I have reviewed the triage vital signs and the nursing notes.  Pertinent labs & imaging results that were available during my care of the patient were reviewed by me and considered in my medical decision making (see chart for details).  80 year old here for evaluation of syncope.  He is afebrile, nonseptic, not ill-appearing.  Apparently with lightheaded episode yesterday with episode of syncope today.  Did have head injury.  He is not anticoagulated.  Syncope occurred when going from sitting to standing.  Apparently has history of orthostatic  dizziness per family in room.  Did note an episode of chest palpitations yesterday when he went from sitting to standing which lasted only few seconds and resolved after his lightheadedness disappeared.  He does not appear clinically fluid overloaded.  He has a nonfocal neuro exam without deficits.  Labs and imaging personally reviewed and interpreted:  CT head without significant abnormality CT cervical without significant abnormality DG chest without significant abnormality DG pelvis without significant abnormality CBC without leukocytosis, no anemia Metabolic panel glucose 108, no additional electrolyte, renal or liver abnormality Trop 6>>5 COVID positive  I reassessed patient.  States he feels significantly improved.  He is requesting DC home.  I discussed with patient given syncopal event, near syncope yesterday recommended admission for further work-up however patient declined admission.  We discussed risk versus benefit of inpatient management versus outpatient management which him and his wife voiced understanding however continued to [facility] admission.  Will give fluid bolus, get orthostatic vital signs and reassessment.  Care transferred to oncoming provider who will follow up after IVF, reassessment and disposition.  Clinical Course as of 06/29/21 1515  Wed Jun 29, 2021  1508 Vasovagal syncope yesterday and today. COVID + IV fluids, walk, orthostatics, d/c....rediscuss [SS]    Clinical Course User Index [SS] Smoot, Freda Jackson   MDM Rules/Calculators/A&P                            Final Clinical Impression(s) / ED Diagnoses Final diagnoses:  COVID  Syncope, unspecified syncope type    Rx / DC Orders ED Discharge Orders     None        Nitish Roes A, PA-C 06/29/21 1516    Virgina Norfolk, DO 06/29/21 1519

## 2021-06-29 NOTE — ED Provider Notes (Signed)
Care assumed from [facility], PA-C at shift change.  Please see her note for further.  Briefly: Syncopal episodes yesterday and today. Decreased oral hydration in the past few days. Suspect vasovagal syncope. COVID +,  workup otherwise negative.  Plan:  Admission idea was floated and patient would like to be discharged with close follow-up. Finish IV fluid bolus, orthostatic vital signs, walk, re-evaluate.  Upon reassessment, patient endorsed feeling much better after fluid administration. Orthostatics without significant drop.  Patient somewhat hypotensive however he states that his entire life his systolic blood pressures been under 110.  He denies any sort of dizziness, lightheadedness, or feeling like he is going to have a syncopal episode during his 13-hour stay in the emergency department this evening.  He was also able to walk throughout the department without any assistance or difficulty.  After further discussion with patient, he continues to wish to manage symptoms at home.  He has been educated on red flag symptoms that would prompt immediate return.  He plans to call his primary care in the morning to schedule an appointment.  He is afebrile, nontoxic-appearing, in no acute distress.  Discharged in stable condition.   Vear Clock 06/29/21 2053    Alvira Monday, MD 06/30/21 [phone]

## 2021-06-29 NOTE — ED Triage Notes (Signed)
Per [facility] pt coming from home where reports dizzy spell yesterday then today having 2 syncopal episodes. Patient initial BP in 80s- given 500CC NS en route. Patient A&0x4.

## 2021-07-20 ENCOUNTER — Telehealth: Payer: Self-pay | Admitting: Emergency Medicine

## 2021-07-21 NOTE — Telephone Encounter (Signed)
I have called and LM on VM for the pt to call us back.  

## 2021-07-22 ENCOUNTER — Ambulatory Visit (INDEPENDENT_AMBULATORY_CARE_PROVIDER_SITE_OTHER): Payer: POS | Admitting: Primary Care

## 2021-07-22 ENCOUNTER — Encounter: Payer: Self-pay | Admitting: Primary Care

## 2021-07-22 ENCOUNTER — Other Ambulatory Visit: Payer: Self-pay

## 2021-07-22 VITALS — BP 122/60 | HR 86 | Temp 98.4°F | Ht 71.0 in | Wt 187.8 lb

## 2021-07-22 DIAGNOSIS — R053 Chronic cough: Secondary | ICD-10-CM

## 2021-07-22 MED ORDER — BENZONATATE 100 MG PO CAPS
100.0000 mg | ORAL_CAPSULE | Freq: Three times a day (TID) | ORAL | 0 refills | Status: DC | PRN
Start: 2021-07-22 — End: 2021-09-26

## 2021-07-22 MED ORDER — AZITHROMYCIN 250 MG PO TABS: ORAL_TABLET | ORAL | 0 refills | Status: DC

## 2021-07-22 NOTE — Progress Notes (Signed)
@Patient  ID: Daniel Dunn, male    DOB: [birth year], 80 y.o.   MRN: [id number]  Chief Complaint  Patient presents with   Follow-up    Patient is here to talk about cough.     Referring provider: Deland Pretty, MD  HPI: 80 year old male, never smoked. PMH significant abnormal CT chest, chronic cough. Patient of Dr. Lamonte Sakai, last seen for consult on 08/09/20 for pulmonary nodule.   Previous [facility] pulmonary encounter: 08/09/20- Dr. Kaleen Mask  80 year old never smoker with a history of hyperlipidemia, colonic polyps, allergic rhinitis, vitamin D deficiency, aortic atherosclerosis.  He was experiencing sore throat, cough with mucous Spring '21 through December. Then CT abd done for some hematuria early September, showed possible nodular disease so prompted CT chest as below. The CT chest questioned PNA, so he received azithro >> cough and sore throat improved. But he is also on benzonatate. He has PND, no GERD. Has zyrtec, and flonase, doesn;t use reliably.    CT scan of the chest 04/26/2020 reviewed by me, shows subcentimeter mediastinal nodes, nonpathologic, some apical bilateral scarring some posterior right upper lobe areas of nodularity, 4 x 3 mm nodular opacity posterior segment left upper lobe, 5 x 3 mm area of nodularity in the lingula with some subtle tree-in-bud type basilar opacities with some associated atelectasis   CT scan of the chest 07/12/2020 reviewed by me shows interval development of subtle tree-in-bud anterior lateral lingular opacity, minimal residual right basilar groundglass (improved), stable posterior right upper lobe nodular opacities, left upper lobe opacity.    07/22/2021 - Interim hx  Patient presents today for acute OV/cough. Patient has been doing fairly well since he last saw Dr. Coralee Rud in January. He called our office one time in September for cough and was given zpack which clear up his cough. Cough started 4-5 days ago. Cough is productive. He has had some low  grade fevers in the evening. Associated nasal congestion/post nasal drip symptoms. He is taking Zyrtec but not using flonase. He is not keen on using nasal sprays but is open to restarting. He had covid in August 2022 and again recently in November 2022.  He did not take antiviral d/t him being asymptomatic. He has been negative on home covid tests.   CT chest in December 2021 showed migratory tree-in-bud inflammatory change in the lingular, improvement in the right basilar ground glass change and stable small nodules. Ground glass changes felt to possibly reflect bronchiolitis or atypical mycobacterial infection. He is due for repeat CT chest in March 2023.    No Known Allergies  Immunization History  Administered Date(s) Administered   Influenza, Quadrivalent, Recombinant, Inj, Pf 06/04/2019, 06/30/2020   Influenza-Unspecified 04/30/2001   PFIZER(Purple Top)SARS-COV-2 Vaccination 08/22/2019, 09/12/2019, 05/31/2020    Past Medical History:  Diagnosis Date   Renal disorder     Tobacco History: Social History   Tobacco Use  Smoking Status Never  Smokeless Tobacco Never   Counseling given: Not Answered   Outpatient Medications Prior to Visit  Medication Sig Dispense Refill   aspirin 81 MG EC tablet 1 tablet     atorvastatin (LIPITOR) 20 MG tablet Take 20 mg by mouth daily.     augmented betamethasone dipropionate (DIPROLENE-AF) 0.05 % cream Apply topically.     cetirizine (ZYRTEC) 10 MG tablet Take 10 mg by mouth daily.     ibuprofen (ADVIL) 600 MG tablet Take 600 mg by mouth 3 (three) times daily.     tamsulosin (FLOMAX) 0.4  MG CAPS capsule Take 0.4 mg by mouth.     Cholecalciferol (VITAMIN D3) 50 MCG (2000 UT) CAPS Take 2 capsules by mouth daily.     ketoconazole (NIZORAL) 2 % cream SMARTSIG:1 Application Topical 1 to 2 Times Daily (Patient not taking: Reported on 07/22/2021)     magnesium oxide (MAG-OX) 400 MG tablet Take 400 mg by mouth daily. (Patient not taking: Reported on  07/22/2021)     azithromycin (ZITHROMAX) 250 MG tablet Take 1 tablet (250 mg total) by mouth as directed. (Patient not taking: Reported on 07/22/2021) 6 tablet 0   benzonatate (TESSALON) 100 MG capsule Take 1 capsule (100 mg total) by mouth 3 (three) times daily as needed for cough. (Patient not taking: Reported on 07/22/2021) 30 capsule 0   No facility-administered medications prior to visit.   Review of Systems  Review of Systems  Constitutional: Negative.   HENT:  Positive for congestion and postnasal drip.   Respiratory:  Positive for cough. Negative for chest tightness and shortness of breath.   Cardiovascular: Negative.     Physical Exam  BP 122/60 (BP Location: Right Arm, Patient Position: Sitting, Cuff Size: Normal)    Pulse 86    Temp 98.4 F (36.9 C) (Oral)    Ht 5\' 11"  (1.803 m)    Wt 187 lb 12.8 oz (85.2 kg)    SpO2 97%    BMI 26.19 kg/m  Physical Exam Constitutional:      Appearance: Normal appearance.  HENT:     Head: Normocephalic and atraumatic.     Right Ear: Tympanic membrane normal.     Ears:     Comments: Dried blood left ear canal    Nose: Congestion and rhinorrhea present.     Mouth/Throat:     Mouth: Mucous membranes are moist.     Pharynx: Oropharynx is clear.  Cardiovascular:     Rate and Rhythm: Normal rate and regular rhythm.     Comments: RRR Pulmonary:     Effort: Pulmonary effort is normal. No respiratory distress.     Breath sounds: No wheezing or rhonchi.     Comments: Distant rales right mid lung; otherwise clear  Musculoskeletal:        General: Normal range of motion.  Skin:    General: Skin is warm and dry.  Neurological:     General: No focal deficit present.     Mental Status: He is alert and oriented to person, place, and time. Mental status is at baseline.     Lab Results:  CBC    Component Value Date/Time   WBC 7.0 06/29/2021 0818   RBC 5.35 06/29/2021 0818   HGB 16.3 06/29/2021 0818   HCT 49.5 06/29/2021 0818   PLT 240  06/29/2021 0818   MCV 92.5 06/29/2021 0818   MCH 30.5 06/29/2021 0818   MCHC 32.9 06/29/2021 0818   RDW 13.5 06/29/2021 0818   LYMPHSABS 1.0 06/29/2021 0818   MONOABS 1.0 06/29/2021 0818   EOSABS 0.3 06/29/2021 0818   BASOSABS 0.0 06/29/2021 0818    BMET    Component Value Date/Time   NA 139 06/29/2021 0818   K 4.3 06/29/2021 0818   CL 105 06/29/2021 0818   CO2 26 06/29/2021 0818   GLUCOSE 108 (H) 06/29/2021 0818   BUN 11 06/29/2021 0818   CREATININE 0.90 06/29/2021 0818   CALCIUM 8.7 (L) 06/29/2021 0818   GFRNONAA >60 06/29/2021 0818    BNP No results found for: BNP  ProBNP No results found for: PROBNP  Imaging: DG Chest 2 View  Result Date: 06/29/2021 CLINICAL DATA:  Syncope, chest pain EXAM: CHEST - 2 VIEW COMPARISON:  Chest radiograph 08/17/2008 FINDINGS: The cardiomediastinal silhouette is normal. The lungs are clear, with no focal consolidation or pulmonary edema. There is no pleural effusion or pneumothorax. There is no acute osseous abnormality. IMPRESSION: No radiographic evidence of acute cardiopulmonary process. Electronically Signed   By: Valetta Mole M.D.   On: 06/29/2021 08:46   DG Pelvis 1-2 Views  Result Date: 06/29/2021 CLINICAL DATA:  Dizzy spell, syncope, fall EXAM: PELVIS - 1-2 VIEW COMPARISON:  None. FINDINGS: No acute fracture or dislocation. No aggressive osseous lesion. Normal alignment. Generalized osteopenia. 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 Soft tissue are unremarkable. No radiopaque foreign body or soft tissue emphysema. IMPRESSION: No acute osseous injury of the pelvis. Electronically Signed   By: Kathreen Devoid M.D.   On: 06/29/2021 08:44   CT HEAD WO CONTRAST (5MM)  Result Date: 06/29/2021 CLINICAL DATA:  Head trauma, moderate to severe EXAM: CT HEAD WITHOUT CONTRAST CT CERVICAL SPINE WITHOUT CONTRAST TECHNIQUE: Multidetector CT imaging of the head and  cervical spine was performed following the standard protocol without intravenous contrast. Multiplanar CT image reconstructions of the cervical spine were also generated. COMPARISON:  None. FINDINGS: CT HEAD FINDINGS Brain: Mild age related volume loss. Minimal small vessel change of the cerebral hemispheric white matter. No sign of acute infarction, mass lesion, hemorrhage, hydrocephalus or extra-axial collection. Vascular: There is atherosclerotic calcification of the major vessels at the base of the brain. Skull: No skull fracture. Sinuses/Orbits: Clear/normal Other: None CT CERVICAL SPINE FINDINGS Alignment: No malalignment. Skull base and vertebrae: No fracture or focal bone lesion. Soft tissues and spinal canal: No soft tissue injury is seen. Disc levels: Mild degenerative spondylosis C3-4 and C6-7. 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 No significant stenosis. Upper chest: Negative Other: None IMPRESSION: Head CT: No acute or traumatic finding. Minimal age related volume loss and small-vessel change of the white matter. Cervical spine CT: No acute or traumatic finding. Ordinary chronic spondylosis and facet osteoarthritis. Electronically Signed   By: Nelson Chimes M.D.   On: 06/29/2021 09:34   CT Cervical Spine Wo Contrast  Result Date: 06/29/2021 CLINICAL DATA:  Head trauma, moderate to severe EXAM: CT HEAD WITHOUT CONTRAST CT CERVICAL SPINE WITHOUT CONTRAST TECHNIQUE: Multidetector CT imaging of the head and cervical spine was performed following the standard protocol without intravenous contrast. Multiplanar CT image reconstructions of the cervical spine were also generated. COMPARISON:  None. FINDINGS: CT HEAD FINDINGS Brain: Mild age related volume loss. Minimal small vessel change of the cerebral hemispheric white matter. No sign of acute infarction, mass lesion, hemorrhage, hydrocephalus or extra-axial collection. Vascular: There is atherosclerotic calcification of the major vessels at  the base of the brain. Skull: No skull fracture. Sinuses/Orbits: Clear/normal Other: None CT CERVICAL SPINE FINDINGS Alignment: No malalignment. Skull base and vertebrae: No fracture or focal bone lesion. Soft tissues and spinal canal: No soft tissue injury is seen. Disc levels: Mild degenerative spondylosis C3-4 and C6-7. 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 No significant stenosis. Upper chest: Negative Other: None IMPRESSION: Head CT: No acute or traumatic finding. Minimal age related volume loss and small-vessel change of the white matter. Cervical spine CT: No acute or traumatic finding. Ordinary chronic spondylosis and facet osteoarthritis. Electronically Signed   By:  Nelson Chimes M.D.   On: 06/29/2021 09:34     Assessment & Plan:   Chronic cough - Chronic cough has been fairly well controlled the last year. He had one flare up in September which cleared with [facility]. Cough started back 4-5 days ago with associated low grade temp. Question of recent covid in November. He has some uncontrolled post nasal drip symptoms, current using zyrtec but not flonase nasal spray. Recommend he resume nasal spray. Concern past migratory tree-in-bud inflammatory change and ground glass changes on CT imaging, which may reflect bronchiolitis or atypical mycobacterial infection. We will get sputum sample and treat again with course of azithromycin since he has responded well previously to abx. We need to get repeat HRCT imaging 2-4 weeks, preferably after acute symptoms clear. If CT imaging shows persistent GGO and patient is unable to provide sputum sample may need to discuss bronchoscopy for culture.      Martyn Ehrich, NP 07/23/2021

## 2021-07-22 NOTE — Patient Instructions (Addendum)
Recommendations: Continue Zyrtec 10mg  daily Resume flonase nasal spray 2 spray per nostril daily Take tessalon perles three times a day as needed for cough   Orders: Sputum culture if able x2  CT chest wo contrast in 4 week  Rx: Zpack   Follow-up: March with Dr. April or sooner if cough does not improve after abx

## 2021-07-23 NOTE — Assessment & Plan Note (Addendum)
-   Chronic cough has been fairly well controlled the last year. He had one flare up in September which cleared with [facility]. Cough started back 4-5 days ago with associated low grade temp. Question of recent covid in November. He has some uncontrolled post nasal drip symptoms, current using zyrtec but not flonase nasal spray. Recommend he resume nasal spray. Concern past migratory tree-in-bud inflammatory change and ground glass changes on CT imaging, which may reflect bronchiolitis or atypical mycobacterial infection. We will get sputum sample and treat again with course of azithromycin since he has responded well previously to abx. We need to get repeat HRCT imaging 2-4 weeks, preferably after acute symptoms clear. If CT imaging shows persistent GGO and patient is unable to provide sputum sample may need to discuss bronchoscopy for culture.

## 2021-07-24 LAB — RESPIRATORY CULTURE OR RESPIRATORY AND SPUTUM CULTURE: MICRO NUMBER:: 12795458

## 2021-07-25 NOTE — Progress Notes (Signed)
Please let patient know sample was unable to be used d/t oropharyngeal contamination (not real sputum)

## 2021-07-26 ENCOUNTER — Telehealth: Payer: Self-pay | Admitting: *Deleted

## 2021-07-26 ENCOUNTER — Telehealth: Payer: Self-pay | Admitting: Primary Care

## 2021-07-26 NOTE — Telephone Encounter (Signed)
Patient's wife, Clydie Braun [facility]) called inquiring about bringing in another sputum sample today.  I reviewed the OV note from when he saw BW and it states sputum culture x2.  Advised they could bring it when it was good for them and that the only day we are closing early is Friday at 12 pm.  She verbalized understanding.  She states they misplaced the cup and would have to get one and get the sample when they arrived.  Advised that would not be a problem.  Nothing further needed.

## 2021-08-03 ENCOUNTER — Other Ambulatory Visit: Payer: POS

## 2021-08-03 ENCOUNTER — Telehealth: Payer: Self-pay | Admitting: Emergency Medicine

## 2021-08-03 DIAGNOSIS — R053 Chronic cough: Secondary | ICD-10-CM

## 2021-08-03 NOTE — Telephone Encounter (Signed)
I called and spoke with the wife and she is going to come by and pick up a sputum cup this afternoon. She is aware that it is on the desk. Nothing further needed.

## 2021-08-10 DIAGNOSIS — R55 Syncope and collapse: Secondary | ICD-10-CM | POA: Insufficient documentation

## 2021-08-10 DIAGNOSIS — I700 Atherosclerosis of aorta: Secondary | ICD-10-CM

## 2021-08-10 HISTORY — DX: Syncope and collapse: R55

## 2021-08-10 HISTORY — DX: Atherosclerosis of aorta: I70.0

## 2021-08-10 NOTE — Progress Notes (Signed)
Cardiology Office Note   Date:  08/11/2021   ID:  Daniel Dunn, DOB [birth year], MRN [id number]  PCP:  Deland Pretty, MD  Cardiologist:   None Referring:  Holland Commons, FNP  Chief Complaint  Patient presents with   Loss of Consciousness      History of Present Illness: Daniel Dunn is a 81 y.o. male who presents for evaluation of syncope.  The patient had a syncopal episode in late November.  He was in the emergency room records and I reviewed these records.  He was thought to be dehydrated and was found to be COVID-positive.  He was treated with hydration.  Head CT demonstrated no acute abnormalities.  Troponin was unremarkable.  There was a EMS suggestion of the patient was in atrial fibrillation but this was not documented in the emergency room.    Of note I do see a CT of the chest done in December 2021 to evaluate a productive cough and history of pulmonary nodules and there was a mention of aortic atherosclerosis  The patient says that he had been doing some yard work and not been hydrated.  He did not feel like he had COVID.  He did not have any cough fevers or chills although recently he has had some sinus drainage and congestion.  He denies any chest pressure, neck or arm discomfort.  He has no new shortness of breath, PND or orthopnea.  He is physically active and chops wood.  He has not have any symptoms related to this and he has not ever had presyncope or syncope before or since then.   Past Medical History:  Diagnosis Date   BPH    Dyslipidemia     Past Surgical History:  Procedure Laterality Date   APPENDECTOMY     TONSILLECTOMY       Current Outpatient Medications  Medication Sig Dispense Refill   aspirin 81 MG EC tablet 1 tablet     atorvastatin (LIPITOR) 20 MG tablet Take 20 mg by mouth daily.     augmented betamethasone dipropionate (DIPROLENE-AF) 0.05 % cream Apply topically.     benzonatate (TESSALON) 100 MG capsule Take 1 capsule (100 mg  total) by mouth 3 (three) times daily as needed for cough. 30 capsule 0   cetirizine (ZYRTEC) 10 MG tablet Take 10 mg by mouth as needed.     Cholecalciferol (VITAMIN D3) 50 MCG (2000 UT) CAPS Take 2 capsules by mouth daily.     ibuprofen (ADVIL) 600 MG tablet Take 600 mg by mouth 3 (three) times daily.     tamsulosin (FLOMAX) 0.4 MG CAPS capsule Take 0.4 mg by mouth.     No current facility-administered medications for this visit.    Allergies:   Patient has no known allergies.    Social History:  The patient  reports that he has never smoked. He has never used smokeless tobacco. He reports that he does not drink alcohol and does not use drugs.   Family History:  The patient's family history includes Alzheimer's disease in his father.    ROS:  Please see the history of present illness.   Otherwise, review of systems are positive for none.   All other systems are reviewed and negative.    PHYSICAL EXAM: VS:  BP 116/61    Pulse 67    Ht 5\' 10"  (1.778 m)    Wt 189 lb (85.7 kg)    SpO2 98%    BMI  27.12 kg/m  , BMI Body mass index is 27.12 kg/m. GENERAL:  Well appearing HEENT:  Pupils equal round and reactive, fundi not visualized, oral mucosa unremarkable NECK:  No jugular venous distention, waveform within normal limits, carotid upstroke brisk and symmetric, no bruits, no thyromegaly LYMPHATICS:  No cervical, inguinal adenopathy LUNGS:  Clear to auscultation bilaterally BACK:  No CVA tenderness CHEST:  Unremarkable HEART:  PMI not displaced or sustained,S1 and S2 within normal limits, no S3, no S4, no clicks, no rubs, no murmurs ABD:  Flat, positive bowel sounds normal in frequency in pitch, no bruits, no rebound, no guarding, no midline pulsatile mass, no hepatomegaly, no splenomegaly EXT:  2 plus pulses throughout, no edema, no cyanosis no clubbing SKIN:  No rashes no nodules NEURO:  Cranial nerves II through XII grossly intact, motor grossly intact throughout PSYCH:  Cognitively  intact, oriented to person place and time    EKG:  EKG is ordered today. The ekg ordered today demonstrates sinus rhythm, rate 67, axis within normal limits, intervals within normal limits, incomplete right bundle branch block, no acute ST-T wave changes.   Recent Labs: 06/29/2021: BUN 11; Creatinine, Ser 0.90; Hemoglobin 16.3; Platelets 240; Potassium 4.3; Sodium 139    Lipid Panel No results found for: CHOL, TRIG, HDL, CHOLHDL, VLDL, LDLCALC, LDLDIRECT    Wt Readings from Last 3 Encounters:  08/11/21 189 lb (85.7 kg)  07/22/21 187 lb 12.8 oz (85.2 kg)  06/29/21 194 lb (88 kg)      Other studies Reviewed: Additional studies/ records that were reviewed today include: ED records. Review of the above records demonstrates:  Please see elsewhere in the note.     ASSESSMENT AND PLAN:  Syncope:   Syncope sounds like it was related to dehydration.  He was also COVID-positive.  Has had no further symptoms since then.  I do not think any further testing is indicated.  Though he has a mildly abnormal EKG with incomplete right bundle branch block I would not suggest that there was any suggestion that this was arrhythmia.  There was a mention of atrial fibrillation but this was only via EMS and there was no suggestion of this the EKG ordered while he was in the emergency room.  Aortic atherosclerosis: He does have some aortic atherosclerosis.  I agree with a statin and I reviewed this with the patient.   Current medicines are reviewed at length with the patient today.  The patient does not have concerns regarding medicines.  The following changes have been made:  no change  Labs/ tests ordered today include: None  Orders Placed This Encounter  Procedures   EKG 12-Lead     Disposition:   FU with me as needed.      Signed, Minus Breeding, MD  08/11/2021 5:44 PM    [facility]

## 2021-08-11 ENCOUNTER — Other Ambulatory Visit: Payer: Self-pay

## 2021-08-11 ENCOUNTER — Encounter: Payer: Self-pay | Admitting: Cardiology

## 2021-08-11 ENCOUNTER — Ambulatory Visit (INDEPENDENT_AMBULATORY_CARE_PROVIDER_SITE_OTHER): Payer: POS | Admitting: Cardiology

## 2021-08-11 VITALS — BP 116/61 | HR 67 | Ht 70.0 in | Wt 189.0 lb

## 2021-08-11 DIAGNOSIS — I700 Atherosclerosis of aorta: Secondary | ICD-10-CM

## 2021-08-11 DIAGNOSIS — R55 Syncope and collapse: Secondary | ICD-10-CM | POA: Diagnosis not present

## 2021-08-11 NOTE — Patient Instructions (Signed)
Medication Instructions:  NO CHANGES  *If you need a refill on your cardiac medications before your next appointment, please call your pharmacy*   Follow-Up: At [facility], you and your health needs are our priority.  As part of our continuing mission to provide you with exceptional heart care, we have created designated Provider Care Teams.  These Care Teams include your primary Cardiologist (physician) and Advanced Practice Providers (APPs -  Physician Assistants and Nurse Practitioners) who all work together to provide you with the care you need, when you need it.  We recommend signing up for the patient portal called "MyChart".  Sign up information is provided on this After Visit Summary.  MyChart is used to connect with patients for Virtual Visits (Telemedicine).  Patients are able to view lab/test results, encounter notes, upcoming appointments, etc.  Non-urgent messages can be sent to your provider as well.   To learn more about what you can do with MyChart, go to [email or web address].    Your next appointment:   AS NEEDED with Dr. Antoine Poche

## 2021-08-18 ENCOUNTER — Telehealth: Payer: Self-pay | Admitting: Emergency Medicine

## 2021-08-18 NOTE — Telephone Encounter (Signed)
Yes.  Notified Clydie Braun.  Nothing further needed at this time.

## 2021-08-22 ENCOUNTER — Other Ambulatory Visit: Payer: Self-pay

## 2021-08-22 ENCOUNTER — Ambulatory Visit (INDEPENDENT_AMBULATORY_CARE_PROVIDER_SITE_OTHER)
Admission: RE | Admit: 2021-08-22 | Discharge: 2021-08-22 | Disposition: A | Discharge status: Home / Self Care | Payer: POS | Source: Ambulatory Visit | Attending: Primary Care | Admitting: Primary Care

## 2021-08-22 DIAGNOSIS — R053 Chronic cough: Secondary | ICD-10-CM | POA: Diagnosis not present

## 2021-08-25 NOTE — Progress Notes (Signed)
Please let patient know CT chest showed stable nodular densities since 04/26/2020. Waxing and waning tree-in-bud nodularity which can be from inflammatory process. We got a sputum sample but it was contaminated. Can he repeat? How is his cough? If still symptomatic please schedule him a sooner apt with Dr. Delton Coombes to discuss possible need for bronchoscopy

## 2021-08-26 ENCOUNTER — Ambulatory Visit: Payer: POS | Admitting: Cardiology

## 2021-09-16 NOTE — Telephone Encounter (Signed)
Looks like encounter was open in error so closing encounter. 

## 2021-09-19 LAB — AFB CULTURE WITH SMEAR (NOT AT ARMC)
Acid Fast Culture: NEGATIVE
Acid Fast Smear: NEGATIVE

## 2021-09-24 NOTE — Progress Notes (Signed)
Cardiology Office Note   Date:  09/26/2021   ID:  Daniel Dunn, Daniel Dunn [birth year], MRN [id number]  PCP:  Merri Brunette, MD  Cardiologist:   None Referring:  Linus Galas, NP  Chief Complaint  Patient presents with   Dizziness      History of Present Illness: Daniel Dunn is a 81 y.o. male who presents for evaluation of syncope.  The patient had a syncopal episode in late November 2022.  He was in the emergency room .  He was thought to be dehydrated and was found to be COVID-positive.  He was treated with hydration.  Head CT demonstrated no acute abnormalities.  Troponin was unremarkable.  There was a EMS suggestion of the patient was in atrial fibrillation but this was not documented in the emergency room.    He has had aortic atherosclerosis on a CT.    Last week he was out again working during one of the hot days.  He became lightheaded and actually his wife had to drive him back from his work spot.  When she took him to get his car the next day he still was not feeling well.  She did record his heart rate to be 115.  She could not record her blood pressure.  A1c to primary provider.  I was able to review these records.  He was in atrial fibrillation.  Blood pressures apparently at home were eventually recorded at 76/55.  He otherwise has felt well.  He does not feel the palpitations.  He otherwise is able to work and has not had any further syncope.  He denies any chest pressure, neck or arm discomfort.  He has had no weight gain or edema.  I was able to review the EKG from the primary care office and he was in fibrillation but his rate was 82   Past Medical History:  Diagnosis Date   BPH    Dyslipidemia     Past Surgical History:  Procedure Laterality Date   APPENDECTOMY     TONSILLECTOMY       Current Outpatient Medications  Medication Sig Dispense Refill   apixaban (ELIQUIS) 5 MG TABS tablet Take 1 tablet (5 mg total) by mouth 2 (two) times daily. 60 tablet 0    apixaban (ELIQUIS) 5 MG TABS tablet Take 1 tablet (5 mg total) by mouth 2 (two) times daily. 180 tablet 3   atorvastatin (LIPITOR) 20 MG tablet Take 20 mg by mouth daily.     augmented betamethasone dipropionate (DIPROLENE-AF) 0.05 % cream Apply topically.     cetirizine (ZYRTEC) 10 MG tablet Take 10 mg by mouth as needed.     Cholecalciferol (VITAMIN D3) 50 MCG (2000 UT) CAPS Take 2 capsules by mouth daily.     ibuprofen (ADVIL) 600 MG tablet Take 600 mg by mouth 3 (three) times daily.     tamsulosin (FLOMAX) 0.4 MG CAPS capsule Take 0.4 mg by mouth.     No current facility-administered medications for this visit.    Allergies:   Patient has no known allergies.    ROS:  Please see the history of present illness.   Otherwise, review of systems are positive for short-term memory loss.   All other systems are reviewed and negative.    PHYSICAL EXAM: VS:  BP 118/62    Pulse 70    Ht 5\' 11"  (1.803 m)    Wt 192 lb 6.4 oz (87.3 kg)    SpO2 98%  BMI 26.83 kg/m  , BMI Body mass index is 26.83 kg/m. GENERAL:  Well appearing NECK:  No jugular venous distention, waveform within normal limits, carotid upstroke brisk and symmetric, no bruits, no thyromegaly LUNGS:  Clear to auscultation bilaterally CHEST:  Unremarkable HEART:  PMI not displaced or sustained,S1 and S2 within normal limits, no S3, no S4, no clicks, no rubs, no murmurs ABD:  Flat, positive bowel sounds normal in frequency in pitch, no bruits, no rebound, no guarding, no midline pulsatile mass, no hepatomegaly, no splenomegaly EXT:  2 plus pulses throughout, trace leg edema, no cyanosis no clubbing   EKG:  EKG is  ordered today. The ekg ordered today demonstrates sinus rhythm, rate 70, axis within normal limits, intervals within normal limits, incomplete right bundle branch block, no acute ST-T wave changes.   Recent Labs: 06/29/2021: BUN 11; Creatinine, Ser 0.90; Hemoglobin 16.3; Platelets 240; Potassium 4.3; Sodium 139     Lipid Panel No results found for: CHOL, TRIG, HDL, CHOLHDL, VLDL, LDLCALC, LDLDIRECT    Wt Readings from Last 3 Encounters:  09/26/21 192 lb 6.4 oz (87.3 kg)  08/11/21 189 lb (85.7 kg)  07/22/21 187 lb 12.8 oz (85.2 kg)      Other studies Reviewed: Additional studies/ records that were reviewed today include: ED records. Review of the above records demonstrates:  Please see elsewhere in the note.     ASSESSMENT AND PLAN:  Syncope:    He has had no further syncopal episodes.  I do think that his probable syncope was related to low blood pressures which again may have been related to atrial fibrillation but also dehydration we talked about this at length.  Of note his blood pressure was low in the office today but he was not orthostatic.  Atrial fibrillation:  Daniel Dunn has a CHA2DS2 - VASc score of 2.  I do not see a recent TSH.  I we will order this, basic metabolic profile and a CBC in a couple of weeks.  I do see from the fall his creatinine was normal and he has no contraindication to the blood thinners so I will be starting Eliquis 5 mg twice daily.  I will then do the follow-up labs.  He will get an echocardiogram.  At this point I am not clear whether dehydration triggers the atrial fibrillation and thus lightheadedness or whether the fibrillation is causing the lightheadedness and low blood pressures.  I will try to correlate to decide whether he needs rhythm management or just rate management with anticoagulation.   Aortic atherosclerosis:   We are working on risk reduction with lipid profile.    Current medicines are reviewed at length with the patient today.  The patient does not have concerns regarding medicines.  The following changes have been made: As above  Labs/ tests ordered today include:    Orders Placed This Encounter  Procedures   TSH   CBC   Basic metabolic panel   CARDIAC EVENT MONITOR   EKG 12-Lead   ECHOCARDIOGRAM COMPLETE      Disposition:   FU with me in 6 weeks   Signed, Rollene Rotunda, MD  09/26/2021 9:17 AM    [facility]

## 2021-09-26 ENCOUNTER — Ambulatory Visit (INDEPENDENT_AMBULATORY_CARE_PROVIDER_SITE_OTHER): Payer: POS | Admitting: Cardiology

## 2021-09-26 ENCOUNTER — Encounter: Payer: Self-pay | Admitting: *Deleted

## 2021-09-26 ENCOUNTER — Other Ambulatory Visit: Payer: Self-pay

## 2021-09-26 ENCOUNTER — Encounter: Payer: Self-pay | Admitting: Cardiology

## 2021-09-26 VITALS — BP 118/62 | HR 70 | Ht 71.0 in | Wt 192.4 lb

## 2021-09-26 DIAGNOSIS — R0989 Other specified symptoms and signs involving the circulatory and respiratory systems: Secondary | ICD-10-CM | POA: Diagnosis not present

## 2021-09-26 DIAGNOSIS — R55 Syncope and collapse: Secondary | ICD-10-CM

## 2021-09-26 DIAGNOSIS — I700 Atherosclerosis of aorta: Secondary | ICD-10-CM

## 2021-09-26 MED ORDER — APIXABAN 5 MG PO TABS: 5.0000 mg | ORAL_TABLET | Freq: Two times a day (BID) | ORAL | 0 refills | Status: DC

## 2021-09-26 MED ORDER — APIXABAN 5 MG PO TABS: 5.0000 mg | ORAL_TABLET | Freq: Two times a day (BID) | ORAL | 3 refills | Status: DC

## 2021-09-26 NOTE — Progress Notes (Signed)
Patient ID: Daniel Dunn, male   DOB: 1941/06/04, 81 y.o.   MRN: [id number] Patient enrolled for [employer] to ship a 30 day cardiac event monitor to his address on file.

## 2021-09-26 NOTE — Patient Instructions (Addendum)
Medication Instructions:   -Start apixaban (Eliquis) 5mg  twice daily.  -Stop aspirin.  *If you need a refill on your cardiac medications before your next appointment, please call your pharmacy*   Lab Work: Your physician recommends that you return for lab work in: 2 weeks for BMET, CBC, TSH  If you have labs (blood work) drawn today and your tests are completely normal, you will receive your results only by: Daniel Dunn (if you have MyChart) OR A paper copy in the mail If you have any lab test that is abnormal or we need to change your treatment, we will call you to review the results.   Testing/Procedures: Your physician has requested that you have an echocardiogram. Echocardiography is a painless test that uses sound waves to create images of your heart. It provides your doctor with information about the size and shape of your heart and how well your hearts chambers and valves are working. This procedure takes approximately one hour. There are no restrictions for this procedure. This procedure will be done at [address]   [employer] Cardiac Event Monitor Instructions Your physician has requested you wear your cardiac event monitor for 30 days. [employer] may call or text to confirm a shipping address. The monitor will be sent to a land address via UPS. [employer] will not ship a monitor to a PO BOX. It typically takes 3-5 days to receive your monitor after it has been enrolled. [employer] will assist with USPS tracking if your package is delayed. The telephone number for [employer] is [phone]. Once you have received your monitor, please review the enclosed instructions. Instruction tutorials can also be viewed under help and settings on the enclosed cell phone. Your monitor has already been registered assigning a specific monitor serial # to you.  Applying the monitor Remove cell phone from case and turn it on. The cell phone works as Dealer  and needs to be within [employer] of you at all times. The cell phone will need to be charged on a daily basis. We recommend you plug the cell phone into the enclosed charger at your bedside table every night.  Monitor batteries: You will receive two monitor batteries labelled #1 and #2. These are your recorders. Plug battery #2 onto the second connection on the enclosed charger. Keep one battery on the charger at all times. This will keep the monitor battery deactivated. It will also keep it fully charged for when you need to switch your monitor batteries. A small light will be blinking on the battery emblem when it is charging. The light on the battery emblem will remain on when the battery is fully charged.  Open package of a Monitor strip. Insert battery #1 into black hood on strip and gently squeeze monitor battery onto connection as indicated in instruction booklet. Set aside while preparing skin.  Choose location for your strip, vertical or horizontal, as indicated in the instruction booklet. Shave to remove all hair from location. There cannot be any lotions, oils, powders, or colognes on skin where monitor is to be applied. Wipe skin clean with enclosed Saline wipe. Dry skin completely.  Peel paper labeled #1 off the back of the Monitor strip exposing the adhesive. Place the monitor on the chest in the vertical or horizontal position shown in the instruction booklet. One arrow on the monitor strip must be pointing upward. Carefully remove paper labeled #2, attaching remainder of strip to your skin. Try not to create any  folds or wrinkles in the strip as you apply it.  Firmly press and release the circle in the center of the monitor battery. You will hear a small beep. This is turning the monitor battery on. The heart emblem on the monitor battery will light up every 5 seconds if the monitor battery in turned on and connected to the patient securely. Do not push and hold the circle down  as this turns the monitor battery off. The cell phone will locate the monitor battery. A screen will appear on the cell phone checking the connection of your monitor strip. This may read poor connection initially but change to good connection within the next minute. Once your monitor accepts the connection you will hear a series of 3 beeps followed by a climbing crescendo of beeps. A screen will appear on the cell phone showing the two monitor strip placement options. Touch the picture that demonstrates where you applied the monitor strip.  Your monitor strip and battery are waterproof. You are able to shower, bathe, or swim with the monitor on. They just ask you do not submerge deeper than 3 feet underwater. We recommend removing the monitor if you are swimming in a lake, river, or ocean.  Your monitor battery will need to be switched to a fully charged monitor battery approximately once a week. The cell phone will alert you of an action which needs to be made.  On the cell phone, tap for details to reveal connection status, monitor battery status, and cell phone battery status. The green dots indicates your monitor is in good status. A red dot indicates there is something that needs your attention.  To record a symptom, click the circle on the monitor battery. In 30-60 seconds a list of symptoms will appear on the cell phone. Select your symptom and tap save. Your monitor will record a sustained or significant arrhythmia regardless of you clicking the button. Some patients do not feel the heart rhythm irregularities. [employer] will notify us of any serious or critical events.  Refer to instruction booklet for instructions on switching batteries, changing strips, the Do not disturb or Pause features, or any additional questions.  Call [employer] at [phone], to confirm your monitor is transmitting and record your baseline. They will answer any questions you may have regarding the  monitor instructions at that time.  Returning the monitor to [place] all equipment back into blue box. Peel off strip of paper to expose adhesive and close box securely. There is a prepaid UPS shipping label on this box. Drop in a UPS drop box, or at a UPS facility like [employer]. You may also contact [employer] to arrange UPS to pick up monitor package at your home.    Follow-Up: At [facility], you and your health needs are our priority.  As part of our continuing mission to provide you with exceptional heart care, we have created designated Provider Care Teams.  These Care Teams include your primary Cardiologist (physician) and Advanced Practice Providers (APPs -  Physician Assistants and Nurse Practitioners) who all work together to provide you with the care you need, when you need it.  We recommend signing up for the patient portal called "MyChart".  Sign up information is provided on this After Visit Summary.  MyChart is used to connect with patients for Virtual Visits (Telemedicine).  Patients are able to view lab/test results, encounter notes, upcoming appointments, etc.  Non-urgent messages can be sent to your provider as well.  To learn more about what you can do with MyChart, go to [email or web address].    Your next appointment:   6 week(s)  The format for your next appointment:   In Person  Provider:   Marijo File, MD

## 2021-09-30 ENCOUNTER — Ambulatory Visit (INDEPENDENT_AMBULATORY_CARE_PROVIDER_SITE_OTHER): Payer: POS

## 2021-09-30 DIAGNOSIS — R55 Syncope and collapse: Secondary | ICD-10-CM | POA: Diagnosis not present

## 2021-09-30 DIAGNOSIS — I700 Atherosclerosis of aorta: Secondary | ICD-10-CM

## 2021-09-30 DIAGNOSIS — R0989 Other specified symptoms and signs involving the circulatory and respiratory systems: Secondary | ICD-10-CM

## 2021-10-05 ENCOUNTER — Telehealth: Payer: Self-pay | Admitting: Cardiology

## 2021-10-05 NOTE — Telephone Encounter (Signed)
Additional strips are included in the preventice monitor kit.   ?If you need additional strips or assistance, [employer] is available 24/7 at [phone].   ?

## 2021-10-05 NOTE — Telephone Encounter (Signed)
Patient's wife has questions regarding patient's monitor. Specifically, she would like to know what to do if it comes off. Please advise. ?

## 2021-10-12 ENCOUNTER — Ambulatory Visit (HOSPITAL_COMMUNITY): Payer: POS | Attending: Cardiovascular Disease

## 2021-10-12 ENCOUNTER — Other Ambulatory Visit: Payer: Self-pay

## 2021-10-12 ENCOUNTER — Other Ambulatory Visit: Payer: POS | Admitting: *Deleted

## 2021-10-12 DIAGNOSIS — R55 Syncope and collapse: Secondary | ICD-10-CM | POA: Diagnosis present

## 2021-10-12 DIAGNOSIS — R0989 Other specified symptoms and signs involving the circulatory and respiratory systems: Secondary | ICD-10-CM | POA: Insufficient documentation

## 2021-10-12 DIAGNOSIS — I700 Atherosclerosis of aorta: Secondary | ICD-10-CM | POA: Insufficient documentation

## 2021-10-12 LAB — BASIC METABOLIC PANEL
BUN/Creatinine Ratio: 14 (ref 10–24)
BUN: 12 mg/dL (ref 8–27)
CO2: 26 mmol/L (ref 20–29)
Calcium: 9.1 mg/dL (ref 8.6–10.2)
Chloride: 99 mmol/L (ref 96–106)
Creatinine, Ser: 0.83 mg/dL (ref 0.76–1.27)
Glucose: 81 mg/dL (ref 70–99)
Potassium: 4.1 mmol/L (ref 3.5–5.2)
Sodium: 137 mmol/L (ref 134–144)
eGFR: 90 mL/min/{1.73_m2} (ref >59)

## 2021-10-12 LAB — ECHOCARDIOGRAM COMPLETE
Area-P 1/2: 4.6 cm2
S' Lateral: 3.1 cm

## 2021-10-12 LAB — CBC
Hematocrit: 44.1 % (ref 37.5–51.0)
Hemoglobin: 15.2 g/dL (ref 13.0–17.7)
MCH: 30.9 pg (ref 26.6–33.0)
MCHC: 34.5 g/dL (ref 31.5–35.7)
MCV: 90 fL (ref 79–97)
Platelets: 265 10*3/uL (ref 150–450)
RBC: 4.92 x10E6/uL (ref 4.14–5.80)
RDW: 13.4 % (ref 11.6–15.4)
WBC: 7 10*3/uL (ref 3.4–10.8)

## 2021-10-12 LAB — TSH: TSH: 1.9 u[IU]/mL (ref 0.450–4.500)

## 2021-10-13 ENCOUNTER — Encounter: Payer: Self-pay | Admitting: *Deleted

## 2021-10-13 ENCOUNTER — Other Ambulatory Visit: Payer: POS

## 2021-10-13 ENCOUNTER — Other Ambulatory Visit: Payer: Self-pay | Admitting: Internal Medicine

## 2021-10-17 ENCOUNTER — Encounter: Payer: Self-pay | Admitting: *Deleted

## 2021-10-17 NOTE — Telephone Encounter (Signed)
This encounter was created in error - please disregard.

## 2021-10-19 ENCOUNTER — Encounter: Payer: Self-pay | Admitting: Emergency Medicine

## 2021-10-19 ENCOUNTER — Other Ambulatory Visit: Payer: Self-pay

## 2021-10-19 ENCOUNTER — Ambulatory Visit (INDEPENDENT_AMBULATORY_CARE_PROVIDER_SITE_OTHER): Payer: POS | Admitting: Emergency Medicine

## 2021-10-19 DIAGNOSIS — R053 Chronic cough: Secondary | ICD-10-CM

## 2021-10-19 DIAGNOSIS — R9389 Abnormal findings on diagnostic imaging of other specified body structures: Secondary | ICD-10-CM

## 2021-10-19 NOTE — Progress Notes (Signed)
? ?Subjective:  ? ? Patient ID: Daniel Dunn, male    DOB: 16-Oct-1940, 81 y.o.   MRN: [id number] ? ?HPI ? ?ROV 10/19/21 --follow-up visit for 81 year old never smoker for chronic cough and tree-in-bud abnormalities anterior lingula, right upper lobe, left upper lobe.  He was last seen here in December and was having flaring of his coughing at that time, possibly associated with increased nasal congestion and drainage.  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  He was continued on Zyrtec, added back fluticasone nasal spray.  Sputum cultures were obtained and repeat CT chest was done as below.  He was treated with azithromycin and reports that his cough is better. He does sometimes get sx when the temp is cold. He is on zyrtec daily, now using nasal steroid prn. No GERD sx. The most recent episode appears to be proceeded by a URI. He is undergoing cardiac monitoring for A Fib currently - had an episode pre-syncope.  ? ?CT chest 08/22/2021 reviewed by me showed stable right upper lobe nodular opacities 9 mm, 5 mm.  Stable 7 mm right upper lobe nodule.  Stable 4 mm left upper lobe nodule, numerous scattered additional micronodules.  The tree-in-bud nodularity previously noted in the inferior lingula was resolved and other areas of subpleural reticulation were improved to stable. ? ?AFB 08/03/2021 is final and was negative.  Respiratory culture 07/22/2021 was inadequate for eval ? ?Review of Systems ?As per HPI ? ? ?Past Medical History:  ?Diagnosis Date  ? BPH   ? Dyslipidemia   ?Hyperlipidemia ?Adenomatous polyps of the colon ?Allergic rhinitis, vitamin D deficiency ?Aortic atherosclerosis ? ? ?Family History  ?Problem Relation Age of Onset  ? Alzheimer's disease Father   ?  ? ?Social History  ? ?Socioeconomic History  ? Marital status: Married  ?  Spouse name: Not on file  ? Number of children: Not on file  ? Years of education: Not on file  ? Highest education level: Not on file  ?Occupational History  ? Not  on file  ?Tobacco Use  ? Smoking status: Never  ? Smokeless tobacco: Never  ?Substance and Sexual Activity  ? Alcohol use: No  ? Drug use: No  ? Sexual activity: Not on file  ?Other Topics Concern  ? Not on file  ?Social History Narrative  ? Married.    ? ?Social Determinants of Health  ? ?Financial Resource Strain: Not on file  ?Food Insecurity: Not on file  ?Transportation Needs: Not on file  ?Physical Activity: Not on file  ?Stress: Not on file  ?Social Connections: Not on file  ?Intimate Partner Violence: Not on file  ?  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  ?Was in [place], some agent orange.  ?Has lived in [place], [place], [place] ?Worked for [employer] ? ?No Known Allergies  ? ?Outpatient Medications Prior to Visit  ?Medication Sig Dispense Refill  ? apixaban (ELIQUIS) 5 MG TABS tablet Take 1 tablet (5 mg total) by mouth 2 (two) times daily. 180 tablet 3  ? atorvastatin (LIPITOR) 20 MG tablet Take 20 mg by mouth daily.    ? augmented betamethasone dipropionate (DIPROLENE-AF) 0.05 % cream Apply topically.    ? cetirizine (ZYRTEC) 10 MG tablet Take 10 mg by mouth as needed.    ? Cholecalciferol (VITAMIN D3) 50 MCG (2000 UT) CAPS Take 2 capsules by mouth daily.    ? ibuprofen (ADVIL) 600 MG tablet Take 600 mg by mouth  3 (three) times daily.    ? tamsulosin (FLOMAX) 0.4 MG CAPS capsule Take 0.4 mg by mouth.    ? apixaban (ELIQUIS) 5 MG TABS tablet Take 1 tablet (5 mg total) by mouth 2 (two) times daily. 60 tablet 0  ? ?No facility-administered medications prior to visit.  ? ? ? ? ?   ?Objective:  ? Physical Exam ?Vitals:  ? 10/19/21 1126  ?BP: 138/76  ?Pulse: 72  ?Temp: 98.1 ?F (36.7 ?C)  ?TempSrc: Oral  ?SpO2: 97%  ?Weight: 189 lb 6.4 oz (85.9 kg)  ?Height: 5\' 10"  (1.778 m)  ? ?Gen: Pleasant, well-nourished, in no distress,  normal affect ? ?ENT: No lesions,  mouth clear,  oropharynx clear, no postnasal drip ? ?Neck: No JVD, no stridor ? ?Lungs: No use of accessory muscles, no crackles or wheezing on normal  respiration, no wheeze on forced expiration ? ?Cardiovascular: RRR, heart sounds normal, no murmur or gallops, no peripheral edema ? ?Musculoskeletal: No deformities, no cyanosis or clubbing ? ?Neuro: alert, awake, non focal ? ?Skin: Warm, no lesions or rash ? ? ?   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  Question whether he may have been colonized with [facility].  His AFB was negative.  He is asymptomatic at this time and the CT is improved.  I do not think we need to plan for dedicated repeat CT or push for bronchoscopy, culture data at this time. ? ?Chronic cough ?Unclear whether he may be colonized with [facility], does have some subtle changes on CT chest.  Cultures negative for AFB.  For now we will treat his chronic rhinitis.  He denies any GERD symptoms.  He does occasionally need benzonatate.  He will call [facility] if he has flaring. ? ? ?[place], MD, PhD ?10/19/2021, 11:54 AM ?[facility] ?[phone] or if no answer [phone] ? ?

## 2021-10-19 NOTE — Patient Instructions (Signed)
Please continue your Zyrtec once daily. ?Restart your steroid nasal spray every day if you begin to experience more nasal congestion, more cough. ?We reviewed your CT scan of the chest today.  This is stable to improved.  We do not need to plan a repeat CT at this time but may consider doing so depending on how your symptoms progress. ?Your sputum culture was negative for mycobacteria.  This is good news. ?Please follow Dr. Delton Coombes in 1 year or sooner if you have any problems. ?Please call our office if you have any flaring of your cough so we can review your symptoms and plan treatment. ?

## 2021-10-19 NOTE — Assessment & Plan Note (Signed)
Unclear whether he may be colonized with [facility], does have some subtle changes on CT chest.  Cultures negative for AFB.  For now we will treat his chronic rhinitis.  He denies any GERD symptoms.  He does occasionally need benzonatate.  He will call us if he has flaring. ?

## 2021-10-19 NOTE — Assessment & Plan Note (Signed)
Some basilar subpleural mild interstitial change, stable pulmonary nodular disease specially in the right upper lobe.  Question whether he may have been colonized with [facility].  His AFB was negative.  He is asymptomatic at this time and the CT is improved.  I do not think we need to plan for dedicated repeat CT or push for bronchoscopy, culture data at this time. ?

## 2021-10-26 ENCOUNTER — Telehealth: Payer: Self-pay

## 2021-10-26 NOTE — Telephone Encounter (Signed)
? ?  Cardiac Monitor Alert ? ?Date of alert:  10/23/21 Sunday   ? ?Patient Name: Daniel Dunn  ?DOB: Apr 25, 1941  ?MRN: [id number]  ? ?[facility] Cardiologist: Dr. Percival Spanish ?[facility] EP:  None   ? ?Monitor Information: ?Cardiac Event Monitor [[employer]]  ?Reason:  Atrial Flutter  ?Ordering provider:  Dr. Percival Spanish  ?  ?Alert ?Atrial Fibrillation/Flutter ?This is the  1st but known  alert for this rhythm.  ?The patient has a hx of Atrial Fibrillation/Flutter.  ?  ?Anticoagulation medication as of 10/26/2021   ? ?    ?  ? apixaban (ELIQUIS) 5 MG TABS tablet Take 1 tablet (5 mg total) by mouth 2 (two) times daily.  ? apixaban (ELIQUIS) 5 MG TABS tablet Take 1 tablet (5 mg total) by mouth 2 (two) times daily.  ? ?  ? ? ?Next Cardiology Appointment   ?Date: 11/15/21 at 4pm   Provider: Dr. Percival Spanish  ? ?The patient could NOT be reached by telephone today.  10/26/21 at 2:30pm ?Left a message for the pt to call back.  ? ?Other: ?Strips to be place in Dr. Rosezella [place] mailbox for 10/27/21 review.  ? ?Stephani Police, RN  ?10/26/2021 2:35 PM  ? ?

## 2021-10-27 ENCOUNTER — Telehealth: Payer: Self-pay | Admitting: Cardiology

## 2021-10-27 NOTE — Telephone Encounter (Signed)
Called patient back- he states that he is feeling okay- triage attempt to contact patient after strips came in for aflutter- strips placed in Dr.Hochrein box by another triage nurse, I advised with patient he states he is fine, was out doors doing yard work yesterday. I advised with patient Dr.Hochrein would review strips and we could call back with any recommendations.  ? ?Patient verbalized understanding, he will send his monitor in on 04/01. ? ?

## 2021-10-27 NOTE — Telephone Encounter (Signed)
? ?  Pt's wife calling, she said, they missed a call from a nurse, she is calling back to f/u. No notes on file that someone is calling pt except a result from [employer] yesterday  ?

## 2021-10-28 NOTE — Telephone Encounter (Signed)
Spoke with patient and wife (speaker phone) to inform them of the monitor event on 10/23/21 and that the strips were placed in Dr. Jenene Slicker mailbox. Patient stated he never had any symptoms on that day. Patient denies having any palpitations, dizziness, or lightheadedness. He will complete the monitoring on 4/4. Recommended that if patient has any dizziness or lightheadedness for him to go to the ED. Wife wants a printout of lab results to pick up next week. These were placed at the front office for her. ?

## 2021-10-31 NOTE — Telephone Encounter (Signed)
Spoke with wife to inform her Dr. Percival Spanish made no changes in patient's therapy. Also, wife wanted me to verify her appointment for her stress test on 4/6. ?

## 2021-11-08 ENCOUNTER — Encounter: Payer: Self-pay | Admitting: *Deleted

## 2021-11-10 ENCOUNTER — Telehealth: Payer: Self-pay | Admitting: Cardiology

## 2021-11-10 NOTE — Telephone Encounter (Signed)
Per anticoag department, ok to provide samples. ? ?Eliquis 5 mg ?Qty: 2 boxes ?Lot # [id number] ?Exp: 2/25 ? ?Pt notified by schedulers samples placed up front for pick up. ? ?

## 2021-11-10 NOTE — Telephone Encounter (Signed)
Patient calling the office for samples of medication: ? ? ?1.  What medication and dosage are you requesting samples for? apixaban (ELIQUIS) 5 MG TABS tablet ? ?2.  Are you currently out of this medication? NO.  Only has 4 pill left.  ?  ?

## 2021-11-13 DIAGNOSIS — I480 Paroxysmal atrial fibrillation: Secondary | ICD-10-CM

## 2021-11-13 HISTORY — DX: Paroxysmal atrial fibrillation: I48.0

## 2021-11-13 NOTE — Progress Notes (Signed)
?  ?Cardiology Office Note ? ? ?Date:  11/16/2021  ? ?Daniel Dunn, DOB 01-May-1941, MRN [id number] ? ?PCP:  Daniel Brunette, MD  ?Cardiologist:   None ?Referring:  Daniel Brunette, MD ? ?Chief Complaint  ?Patient presents with  ? Dizziness  ? ? ?  ?History of Present Illness: ?Daniel Dunn is a 81 y.o. male who presents for evaluation of syncope.  The patient had a syncopal episode in late November 2022.  He was in the emergency room .  He was thought to be dehydrated and was found to be COVID-positive.  He was treated with hydration.  Head CT demonstrated no acute abnormalities.  Troponin was unremarkable.  There was a EMS suggestion of the patient was in atrial fibrillation but this was not documented in the emergency room.    He has had aortic atherosclerosis on a CT.  He had presyncope prior to the last visit.  Echo demonstrated no acute abnormalities.  He had no etiology identified on a monitor.   I was able to review the EKG from the primary care office and he was in fibrillation but his rate was 82.  He has been taking Eliquis. ? ?He has had a couple of dizzy episodes.  However, he has been having when he bends over he is outside the top.  He has not had any frank syncope.  He does not feel any palpitations.  He is not any chest pressure, neck or arm discomfort.  He had no weight gain or edema. ? ? ?Past Medical History:  ?Diagnosis Date  ? BPH   ? Dyslipidemia   ? ? ?Past Surgical History:  ?Procedure Laterality Date  ? APPENDECTOMY    ? TONSILLECTOMY    ? ? ? ?Current Outpatient Medications  ?Medication Sig Dispense Refill  ? apixaban (ELIQUIS) 5 MG TABS tablet Take 1 tablet (5 mg total) by mouth 2 (two) times daily. 60 tablet 0  ? atorvastatin (LIPITOR) 20 MG tablet Take 20 mg by mouth daily.    ? augmented betamethasone dipropionate (DIPROLENE-AF) 0.05 % cream Apply topically.    ? cetirizine (ZYRTEC) 10 MG tablet Take 10 mg by mouth as needed.    ? Cholecalciferol (VITAMIN D3) 50 MCG (2000 UT) CAPS  Take 2 capsules by mouth daily.    ? ibuprofen (ADVIL) 600 MG tablet Take 600 mg by mouth 3 (three) times daily.    ? tamsulosin (FLOMAX) 0.4 MG CAPS capsule Take 0.4 mg by mouth.    ? ?No current facility-administered medications for this visit.  ? ? ?Allergies:   Patient has no known allergies.  ? ? ?ROS:  Please see the history of present illness.   Otherwise, review of systems are positive for none.   All other systems are reviewed and negative.  ? ? ?PHYSICAL EXAM: ?VS:  BP (!) 106/50 (BP Location: Left Arm, Patient Position: Sitting, Cuff Size: Normal)   Pulse 64   Ht 5\' 10"  (1.778 m)   Wt 187 lb (84.8 kg)   BMI 26.83 kg/m?  , BMI Body mass index is 26.83 kg/m?. ?GENERAL:  Well appearing ?NECK:  No jugular venous distention, waveform within normal limits, carotid upstroke brisk and symmetric, no bruits, no thyromegaly ?LUNGS:  Clear to auscultation bilaterally ?CHEST:  Unremarkable ?HEART:  PMI not displaced or sustained,S1 and S2 within normal limits, no S3, no S4, no clicks, no rubs, no murmurs ?ABD:  Flat, positive bowel sounds normal in frequency in pitch, no  bruits, no rebound, no guarding, no midline pulsatile mass, no hepatomegaly, no splenomegaly ?EXT:  2 plus pulses throughout, no edema, no cyanosis no clubbing ? ?EKG:  EKG is not ordered today. ? ? ? ?Recent Labs: ?10/12/2021: BUN 12; Creatinine, Ser 0.83; Hemoglobin 15.2; Platelets 265; Potassium 4.1; Sodium 137; TSH 1.900  ? ? ?Lipid Panel ?No results found for: CHOL, TRIG, HDL, CHOLHDL, VLDL, LDLCALC, LDLDIRECT ?  ? ?Wt Readings from Last 3 Encounters:  ?11/15/21 187 lb (84.8 kg)  ?10/19/21 189 lb 6.4 oz (85.9 kg)  ?09/26/21 192 lb 6.4 oz (87.3 kg)  ?  ? ? ?Other studies Reviewed: ?Additional studies/ records that were reviewed today include: none ?Review of the above records demonstrates:  Please see elsewhere in the note.   ? ? ?ASSESSMENT AND PLAN: ? ?Syncope:    The etiology of this is not probably related to any arrhythmia.  I suspect he  was dehydrated as above.  No further work-up.  ? ?Atrial fibrillation:  Mr. Daniel Dunn has a CHA2DS2 - VASc score of 2.  He tolerates anticoagulation.  He had no contraindication and we had a long discussion about risk benefits and he wants to proceed continue to take this. ? ?Aortic atherosclerosis:   He is being managed with risk reduction.  He has an excellent lipid profile.  Blood pressure is controlled.  No change in therapy.  ? ? ?Current medicines are reviewed at length with the patient today.  The patient does not have concerns regarding medicines. ? ?The following changes have been made:  None ? ?Labs/ tests ordered today include:  None ? ?No orders of the defined types were placed in this encounter. ? ? ? ?Disposition:   FU with me in 12 months.  ? ?Signed, ?Rollene Rotunda, MD  ?11/16/2021 12:36 PM    ?[facility] ? ? ? ?

## 2021-11-15 ENCOUNTER — Encounter: Payer: Self-pay | Admitting: Cardiology

## 2021-11-15 ENCOUNTER — Ambulatory Visit (INDEPENDENT_AMBULATORY_CARE_PROVIDER_SITE_OTHER): Payer: POS | Admitting: Cardiology

## 2021-11-15 VITALS — BP 106/50 | HR 64 | Ht 70.0 in | Wt 187.0 lb

## 2021-11-15 DIAGNOSIS — R55 Syncope and collapse: Secondary | ICD-10-CM

## 2021-11-15 DIAGNOSIS — I480 Paroxysmal atrial fibrillation: Secondary | ICD-10-CM

## 2021-11-15 NOTE — Patient Instructions (Signed)
Medication Instructions:  ?No changes ?*If you need a refill on your cardiac medications before your next appointment, please call your pharmacy* ? ? ?Lab Work: ?None ordered ?If you have labs (blood work) drawn today and your tests are completely normal, you will receive your results only by: ?MyChart Message (if you have MyChart) OR ?A paper copy in the mail ?If you have any lab test that is abnormal or we need to change your treatment, we will call you to review the results. ? ? ?Testing/Procedures: ?None ordered ? ? ?Follow-Up: ?At [facility], you and your health needs are our priority.  As part of our continuing mission to provide you with exceptional heart care, we have created designated Provider Care Teams.  These Care Teams include your primary Cardiologist (physician) and Advanced Practice Providers (APPs -  Physician Assistants and Nurse Practitioners) who all work together to provide you with the care you need, when you need it. ? ?We recommend signing up for the patient portal called "MyChart".  Sign up information is provided on this After Visit Summary.  MyChart is used to connect with patients for Virtual Visits (Telemedicine).  Patients are able to view lab/test results, encounter notes, upcoming appointments, etc.  Non-urgent messages can be sent to your provider as well.   ?To learn more about what you can do with MyChart, go to [email or web address].   ? ?Your next appointment:   ?12 month(s) ? ?The format for your next appointment:   ?In Person ? ?Provider:   ?Dr. Hochrein ? ?Important Information About Sugar ? ? ? ? ? ? ?

## 2021-11-16 ENCOUNTER — Encounter: Payer: Self-pay | Admitting: Cardiology

## 2022-01-20 ENCOUNTER — Telehealth: Payer: Self-pay | Admitting: Cardiology

## 2022-10-04 ENCOUNTER — Other Ambulatory Visit: Payer: Self-pay | Admitting: Cardiology

## 2022-10-04 NOTE — Telephone Encounter (Signed)
Pt last saw Dr Percival Spanish 11/15/21, last labs 10/12/21 Creat 0.83, age 82, weight 84.8kg, based on specified criteria pt is on appropriate dosage of Eliquis '5mg'$  BID for afib.  Will refill rx.

## 2022-11-06 ENCOUNTER — Ambulatory Visit: Payer: Commercial Managed Care - PPO | Attending: Nurse Practitioner | Admitting: Nurse Practitioner

## 2022-11-06 ENCOUNTER — Encounter: Payer: Self-pay | Admitting: Nurse Practitioner

## 2022-11-06 VITALS — BP 104/66 | HR 65 | Ht 71.0 in | Wt 182.0 lb

## 2022-11-06 DIAGNOSIS — I700 Atherosclerosis of aorta: Secondary | ICD-10-CM | POA: Diagnosis not present

## 2022-11-06 DIAGNOSIS — R42 Dizziness and giddiness: Secondary | ICD-10-CM

## 2022-11-06 DIAGNOSIS — I480 Paroxysmal atrial fibrillation: Secondary | ICD-10-CM

## 2022-11-06 DIAGNOSIS — E782 Mixed hyperlipidemia: Secondary | ICD-10-CM

## 2022-11-06 DIAGNOSIS — I951 Orthostatic hypotension: Secondary | ICD-10-CM | POA: Diagnosis not present

## 2022-11-06 DIAGNOSIS — Z87898 Personal history of other specified conditions: Secondary | ICD-10-CM

## 2022-11-06 NOTE — Progress Notes (Signed)
Office Visit    Patient Name: Daniel DrainHal R Allsbrook Date of Encounter: 11/06/2022  Primary Care Provider:  Merri BrunettePharr, Walter, MD Primary Cardiologist:  Rollene RotundaJames Hochrein, MD  Chief Complaint    82 year old male with a history of syncope, dizziness, paroxysmal atrial fibrillation, aortic atherosclerosis, dyslipidemia, and BPH who presents for follow-up related to dizziness.   Past Medical History    Past Medical History:  Diagnosis Date   BPH    Dyslipidemia    Past Surgical History:  Procedure Laterality Date   APPENDECTOMY     TONSILLECTOMY      Allergies  No Known Allergies   Labs/Other Studies Reviewed    The following studies were reviewed today: Echo 09/2021: IMPRESSIONS    1. Left ventricular ejection fraction, by estimation, is 60 to 65%. The  left ventricle has normal function. The left ventricle has no regional  wall motion abnormalities. Left ventricular diastolic parameters were  normal. The average left ventricular  global longitudinal strain is -24.6 %. The global longitudinal strain is  normal.   2. Right ventricular systolic function is normal. The right ventricular  size is normal. There is normal pulmonary artery systolic pressure.   3. The mitral valve is normal in structure. No evidence of mitral valve  regurgitation. No evidence of mitral stenosis.   4. The aortic valve is tricuspid. Aortic valve regurgitation is not  visualized. No aortic stenosis is present.   5. Aortic dilatation noted. There is mild dilatation of the aortic root,  measuring 37 mm. There is mild dilatation of the ascending aorta,  measuring 37 mm.   6. The inferior vena cava is normal in size with greater than 50%  respiratory variability, suggesting right atrial pressure of 3 mmHg.   Monitor 10/2021:  There are runs of SVT but I don't see any cause for syncope.  Also I do not see flutter.  SVT is not prolonged and he did not have symptoms   Recent Labs: No results found for  requested labs within last 365 days.  Recent Lipid Panel No results found for: "CHOL", "TRIG", "HDL", "CHOLHDL", "VLDL", "LDLCALC", "LDLDIRECT"  History of Present Illness    82 year old male with the above past medical history including syncope, dizziness, paroxysmal atrial fibrillation, aortic atherosclerosis, dyslipidemia, and BPH.  He had a syncopal episode in November 2022.  He was evaluated in the ED.  He was thought to be dehydrated and was positive for COVID-19 at the time.  Head CT demonstrated no acute abnormalities.  Troponin was unremarkable.  There was EMS suggestion of atrial fibrillation, however, this was not documented in the emergency room.  He does have a history of paroxysmal atrial fibrillation, on Eliquis.  He has a history of aortic atherosclerosis noted on prior CT.  Echocardiogram in 09/2021 showed EF 60 to 65%, normal LV function, no RWMA, normal RV systolic function, no significant valvular abnormalities.  Cardiac monitor in 10/2021 showed brief runs of SVT, no atrial fibrillation/flutter.  He was last seen in the office on 11/15/2021.  He noted episodes of dizziness, presyncope.  He denied palpitations.  He presents today for follow-up accompanied by his wife.  Since his last visit he has been stable overall from a cardiac standpoint.  He notes 3 episodes over the past year of transient dizziness which has lasted for 15 to 20 seconds at a time and resolved with seated rest.  Two of these episodes have been in the setting of doing work outdoors in the  heat.  Most recent episode occurred while he was standing for a prolonged period of time during choir practice.  He denies any presyncope, syncope.  Denies any palpitations, denies symptoms concerning for angina.  Than his rare episodes of dizziness, he reports feeling well.  Home Medications    Current Outpatient Medications  Medication Sig Dispense Refill   apixaban (ELIQUIS) 5 MG TABS tablet TAKE 1 TABLET BY MOUTH TWICE A DAY  180 tablet 1   atorvastatin (LIPITOR) 20 MG tablet Take 20 mg by mouth daily.     augmented betamethasone dipropionate (DIPROLENE-AF) 0.05 % cream Apply topically.     cetirizine (ZYRTEC) 10 MG tablet Take 10 mg by mouth as needed.     Cholecalciferol (VITAMIN D3) 50 MCG (2000 UT) CAPS Take 2 capsules by mouth daily.     ibuprofen (ADVIL) 600 MG tablet Take 600 mg by mouth every 8 (eight) hours as needed.     tamsulosin (FLOMAX) 0.4 MG CAPS capsule Take 0.4 mg by mouth.     No current facility-administered medications for this visit.     Review of Systems    He denies chest pain, palpitations, dyspnea, pnd, orthopnea, n, v, syncope, edema, weight gain, or early satiety. All other systems reviewed and are otherwise negative except as noted above.   Physical Exam    VS:  BP 104/66   Pulse 65   Ht 5\' 11"  (1.803 m)   Wt 182 lb (82.6 kg)   BMI 25.38 kg/m  Orthostatic VS for the past 24 hrs (Last 3 readings):  BP- Lying Pulse- Lying BP- Sitting Pulse- Sitting BP- Standing at 0 minutes Pulse- Standing at 0 minutes BP- Standing at 3 minutes Pulse- Standing at 3 minutes  11/06/22 1130 121/58 58 105/64 70 94/52 76 98/59 75   GEN: Well nourished, well developed, in no acute distress. HEENT: normal. Neck: Supple, no JVD, carotid   bruits, or masses. Cardiac: RRR, no murmurs, rubs, or gallops. No clubbing, cyanosis, edema.  Radials/DP/PT 2+ and equal bilaterally.  Respiratory:  Respirations regular and unlabored, clear to auscultation bilaterally. GI: Soft, nontender, nondistended, BS + x 4. MS: no deformity or atrophy. Skin: warm and dry, no rash. Neuro:  Strength and sensation are intact. Psych: Normal affect.  Accessory Clinical Findings    ECG personally reviewed by me today -NSR, 65 bpm- no acute changes.   Lab Results  Component Value Date   WBC 7.0 10/12/2021   HGB 15.2 10/12/2021   HCT 44.1 10/12/2021   MCV 90 10/12/2021   PLT 265 10/12/2021   Lab Results  Component  Value Date   CREATININE 0.83 10/12/2021   BUN 12 10/12/2021   NA 137 10/12/2021   K 4.1 10/12/2021   CL 99 10/12/2021   CO2 26 10/12/2021   No results found for: "ALT", "AST", "GGT", "ALKPHOS", "BILITOT" No results found for: "CHOL", "HDL", "LDLCALC", "LDLDIRECT", "TRIG", "CHOLHDL"  No results found for: "HGBA1C"  Assessment & Plan   1. Dizziness/orthostatic hypotension:  He has had 3 episodes over the past year of transient dizziness, lasting for approximately 15 to 20 seconds at a time, resolved with seated rest.  Orthostatics are positive in office today. EKG shows sinus rhythm. I suspect his symptoms are largely due to orthostatic hypotension.  He is not on any antihypertensive medications. Discussed ED precautions, adequate hydration, gradual position changes, compression.   2. History of syncope: Thought to be secondary to dehydration.  Echo in 09/2021 was normal.  Cardiac monitor in 10/2021 showed brief runs of SVT, no atrial fibrillation/flutter.  Syncope thought to be unrelated to arrhythmia.  Likely in the setting of dehydration/orthostatic hypotension.  Intermittent dizziness as above.  Denies any recurrent presyncope, syncope.  3. Paroxysmal atrial fibrillation: Maintaining NSR. Denies any recent palpitations.   Discussed monitoring of cardiac rhythm/HR with Kardia mobile device.  Continue Eliquis.  4. Aortic atherosclerosis: Noted on prior CT. Stable with no anginal symptoms.  BP/cholesterol well-controlled.  5. Dyslipidemia: No recent LDL on file.  Monitored and managed per PCP.  Continue Lipitor.  6. Disposition:  Follow-up as scheduled with Dr. Antoine Poche in 11/2022 (per pt request).      Joylene Grapes, NP 11/06/2022, 12:37 PM

## 2022-11-06 NOTE — Patient Instructions (Signed)
Medication Instructions:  Your physician recommends that you continue on your current medications as directed. Please refer to the Current Medication list given to you today.  *If you need a refill on your cardiac medications before your next appointment, please call your pharmacy*   Lab Work: NONE ordered at this time of appointment   If you have labs (blood work) drawn today and your tests are completely normal, you will receive your results only by: MyChart Message (if you have MyChart) OR A paper copy in the mail If you have any lab test that is abnormal or we need to change your treatment, we will call you to review the results.   Testing/Procedures: NONE ordered at this time of appointment    Follow-Up: At [facility], you and your health needs are our priority.  As part of our continuing mission to provide you with exceptional heart care, we have created designated Provider Care Teams.  These Care Teams include your primary Cardiologist (physician) and Advanced Practice Providers (APPs -  Physician Assistants and Nurse Practitioners) who all work together to provide you with the care you need, when you need it.  We recommend signing up for the patient portal called "MyChart".  Sign up information is provided on this After Visit Summary.  MyChart is used to connect with patients for Virtual Visits (Telemedicine).  Patients are able to view lab/test results, encounter notes, upcoming appointments, etc.  Non-urgent messages can be sent to your provider as well.   To learn more about what you can do with MyChart, go to [email or web address].    Your next appointment:    Keep Follow up   Provider:   Rollene RotundaJames Hochrein, MD     Other Instructions Discussed KardiMobile Device  Orthostatic Hypotension Blood pressure is a measurement of how strongly, or weakly, your circulating blood is pressing against the walls of your arteries. Orthostatic hypotension is a drop in blood  pressure that can happen when you change positions, such as when you go from lying down to standing. Arteries are blood vessels that carry blood from your heart throughout your body. When blood pressure is too low, you may not get enough blood to your brain or to the rest of your organs. Orthostatic hypotension can cause light-headedness, sweating, rapid heartbeat, blurred vision, and fainting. These symptoms require further investigation into the cause. What are the causes? Orthostatic hypotension can be caused by many things, including: Sudden changes in posture, such as standing up quickly after you have been sitting or lying down. Loss of blood (anemia) or loss of body fluids (dehydration). Heart problems, neurologic problems, or hormone problems. Pregnancy. Aging. The risk for this condition increases as you get older. Severe infection (sepsis). Certain medicines, such as medicines for high blood pressure or medicines that make the body lose excess fluids (diuretics). What are the signs or symptoms? Symptoms of this condition may include: Weakness, light-headedness, or dizziness. Sweating. Blurred vision. Tiredness (fatigue). Rapid heartbeat. Fainting, in severe cases. How is this diagnosed? This condition is diagnosed based on: Your symptoms and medical history. Your blood pressure measurements. Your health care provider will check your blood pressure when you are: Lying down. Sitting. Standing. A blood pressure reading is recorded as two numbers, such as "120 over 80" (or 120/80). The first ("top") number is called the systolic pressure. It is a measure of the pressure in your arteries as your heart beats. The second ("bottom") number is called the diastolic pressure. It is a  measure of the pressure in your arteries when your heart relaxes between beats. Blood pressure is measured in a unit called mmHg. Healthy blood pressure for most adults is 120/80 mmHg. Orthostatic hypotension is  defined as a 20 mmHg drop in systolic pressure or a 10 mmHg drop in diastolic pressure within 3 minutes of standing. Other information or tests that may be used to diagnose orthostatic hypotension include: Your other vital signs, such as your heart rate and temperature. Blood tests. An electrocardiogram (ECG) or echocardiogram. A Holter monitor. This is a device you wear that records your heart rhythm continuously, usually for 24-48 hours. Tilt table test. For this test, you will be safely secured to a table that moves you from a lying position to an upright position. Your heart rhythm and blood pressure will be monitored during the test. How is this treated? This condition may be treated by: Changing your diet. This may involve eating more salt (sodium) or drinking more water. Changing the dosage of certain medicines you are taking that might be lowering your blood pressure. Correcting the underlying reason for the orthostatic hypotension. Wearing compression stockings. Taking medicines to raise your blood pressure. Avoiding actions that trigger symptoms. Follow these instructions at home: Medicines Take over-the-counter and prescription medicines only as told by your health care provider. Follow instructions from your health care provider about changing the dosage of your current medicines, if this applies. Do not stop or adjust any of your medicines on your own. Eating and drinking  Drink enough fluid to keep your urine pale yellow. Eat extra salt only as directed. Do not add extra salt to your diet unless advised by your health care provider. Eat frequent, small meals. Avoid standing up suddenly after eating. General instructions  Get up slowly from lying down or sitting positions. This gives your blood pressure a chance to adjust. Avoid hot showers and excessive heat as directed by your health care provider. Engage in regular physical activity as directed by your health care  provider. If you have compression stockings, wear them as told. Keep all follow-up visits. This is important. Contact a health care provider if: You have a fever for more than 2-3 days. You feel more thirsty than usual. You feel dizzy or weak. Get help right away if: You have chest pain. You have a fast or irregular heartbeat. You become sweaty or feel light-headed. You feel short of breath. You faint. You have any symptoms of a stroke. "BE FAST" is an easy way to remember the main warning signs of a stroke: B - Balance. Signs are dizziness, sudden trouble walking, or loss of balance. E - Eyes. Signs are trouble seeing or a sudden change in vision. F - Face. Signs are sudden weakness or numbness of the face, or the face or eyelid drooping on one side. A - Arms. Signs are weakness or numbness in an arm. This happens suddenly and usually on one side of the body. S - Speech. Signs are sudden trouble speaking, slurred speech, or trouble understanding what people say. T - Time. Time to call emergency services. Write down what time symptoms started. You have other signs of a stroke, such as: A sudden, severe headache with no known cause. Nausea or vomiting. Seizure. These symptoms may represent a serious problem that is an emergency. Do not wait to see if the symptoms will go away. Get medical help right away. Call your local emergency services (911 in the [place] Do not drive yourself  to the hospital. Summary Orthostatic hypotension is a sudden drop in blood pressure. It can cause light-headedness, sweating, rapid heartbeat, blurred vision, and fainting. Orthostatic hypotension can be diagnosed by having your blood pressure taken while lying down, sitting, and then standing. Treatment may involve changing your diet, wearing compression stockings, sitting up slowly, adjusting your medicines, or correcting the underlying reason for the orthostatic hypotension. Get help right away if you have  chest pain, a fast or irregular heartbeat, or symptoms of a stroke. This information is not intended to replace advice given to you by your health care provider. Make sure you discuss any questions you have with your health care provider. Document Revised: 09/30/2020 Document Reviewed: 09/30/2020 Elsevier Patient Education  2023 [employer].

## 2022-12-13 NOTE — Progress Notes (Signed)
Cardiology Office Note:   Date:  12/14/2022  ID:  ATA ABBITT, DOB 1941-01-27, MRN [id number]  History of Present Illness:   Daniel Dunn is a 82 y.o. male who presents for evaluation of syncope.  The patient had a syncopal episode in late November 2022.  He was in the emergency room .  He was thought to be dehydrated and was found to be COVID-positive.  He was treated with hydration.  Head CT demonstrated no acute abnormalities.  Troponin was unremarkable.  There was a EMS suggestion of the patient was in atrial fibrillation but this was not documented in the emergency room.    He has had aortic atherosclerosis on a CT.  He had presyncope prior to the last visit.  Echo demonstrated no acute abnormalities.  He had no etiology identified on a monitor.   I was able to review the EKG from the primary care office and he was in fibrillation but his rate was 82.  He has been taking Eliquis.  He had dizziness in church and saw our NP.   he had some orthostatic symptoms than has some lower blood pressure at baseline.  This was not thought to be related to arrhythmia.  He works hard and gets up and down a lot under cars and doing stuff around the yard.  He might feel some dizziness with this.  He works in the sun and he might feel some dizziness with this.  He is not noticing his fibrillation.    Of note he did not drop his blood pressure today with orthostatics but it starts below.  ROS: As stated in the HPI and negative for all other systems.  Studies Reviewed:    EKG:  NA    Risk Assessment/Calculations:    CHA2DS2-VASc Score = 2  {This indicates a 2.2% annual risk of stroke. The patient's score is based upon:       Physical Exam:   VS:  BP (!) 120/58 (BP Location: Right Arm, Patient Position: Sitting, Cuff Size: Normal)   Pulse 63   Ht 5\' 11"  (1.803 m)   Wt 183 lb 12.8 oz (83.4 kg)   BMI 25.63 kg/m    Wt Readings from Last 3 Encounters:  12/14/22 183 lb 12.8 oz (83.4 kg)  11/06/22  182 lb (82.6 kg)  11/15/21 187 lb (84.8 kg)     GEN: Well nourished, well developed in no acute distress NECK: No JVD; No carotid bruits CARDIAC: RRR, no murmurs, rubs, gallops RESPIRATORY:  Clear to auscultation without rales, wheezing or rhonchi  ABDOMEN: Soft, non-tender, non-distended EXTREMITIES:  No edema; No deformity   ASSESSMENT AND PLAN:   Syncope:    I think this is probably been related to his low blood pressure.  I think the first thing is for him to ask his urologist about the Flomax as it can be associated with hypotension and orthostatic symptoms about 50% of the time.  If this is not thought to be contributory then I would probably have to start him on midodrine.  Atrial fibrillation:  Daniel Dunn has a CHA2DS2 - VASc score of 2.  He tolerates anticoagulation.  No change in therapy.    Aortic atherosclerosis:   He will continue on the meds as listed.  LDL was 59 with an HDL of 47.  No change in therapy.  He is being managed with risk reduction.  He has an excellent lipid profile.  Blood pressure is controlled.  No change in therapy.   Signed, Rollene Rotunda, MD

## 2022-12-14 ENCOUNTER — Ambulatory Visit: Payer: 59 | Attending: Cardiology | Admitting: Cardiology

## 2022-12-14 ENCOUNTER — Encounter: Payer: Self-pay | Admitting: Cardiology

## 2022-12-14 VITALS — BP 120/58 | HR 63 | Ht 71.0 in | Wt 183.8 lb

## 2022-12-14 DIAGNOSIS — I700 Atherosclerosis of aorta: Secondary | ICD-10-CM

## 2022-12-14 DIAGNOSIS — R55 Syncope and collapse: Secondary | ICD-10-CM | POA: Diagnosis not present

## 2022-12-14 DIAGNOSIS — I480 Paroxysmal atrial fibrillation: Secondary | ICD-10-CM | POA: Diagnosis not present

## 2022-12-14 NOTE — Patient Instructions (Addendum)
Medication Instructions:  Your physician recommends that you continue on your current medications as directed. Please refer to the Current Medication list given to you today.  *If you need a refill on your cardiac medications before your next appointment, please call your pharmacy*  Lab Work: BMET, CBC at PCP next week  If you have labs (blood work) drawn today and your tests are completely normal, you will receive your results only by: MyChart Message (if you have MyChart) OR A paper copy in the mail If you have any lab test that is abnormal or we need to change your treatment, we will call you to review the results.  Follow-Up: At [facility], you and your health needs are our priority.  As part of our continuing mission to provide you with exceptional heart care, we have created designated Provider Care Teams.  These Care Teams include your primary Cardiologist (physician) and Advanced Practice Providers (APPs -  Physician Assistants and Nurse Practitioners) who all work together to provide you with the care you need, when you need it.  We recommend signing up for the patient portal called "MyChart".  Sign up information is provided on this After Visit Summary.  MyChart is used to connect with patients for Virtual Visits (Telemedicine).  Patients are able to view lab/test results, encounter notes, upcoming appointments, etc.  Non-urgent messages can be sent to your provider as well.   To learn more about what you can do with MyChart, go to [email or web address].    Your next appointment:   6 month(s)  Provider:   Bernadene Person, NP

## 2022-12-29 LAB — LAB REPORT - SCANNED: EGFR: 88

## 2023-01-15 ENCOUNTER — Telehealth: Payer: Self-pay | Admitting: Cardiology

## 2023-01-15 NOTE — Telephone Encounter (Signed)
Paper Work Dropped Off:  Lab reports  Date: 06.17.24 @11 :52 am  Location of paper: Put in [facility] [employer]

## 2023-01-17 NOTE — Telephone Encounter (Signed)
Lab results placed for MD to review.

## 2023-01-26 NOTE — Telephone Encounter (Signed)
Left message for patient, dr hochrein is not making any changes based on the lab results he brought by the office.

## 2023-03-14 ENCOUNTER — Ambulatory Visit: Payer: 59 | Admitting: Emergency Medicine

## 2023-03-14 ENCOUNTER — Encounter: Payer: Self-pay | Admitting: Emergency Medicine

## 2023-03-14 VITALS — BP 92/58 | HR 84 | Temp 97.6°F | Resp 16 | Ht 71.0 in | Wt 186.6 lb

## 2023-03-14 DIAGNOSIS — R053 Chronic cough: Secondary | ICD-10-CM

## 2023-03-14 DIAGNOSIS — R9389 Abnormal findings on diagnostic imaging of other specified body structures: Secondary | ICD-10-CM

## 2023-03-14 NOTE — Assessment & Plan Note (Signed)
Not active at this time.  He has had some flares with viral infections and also a slight increase in cough burden during the spring and fall.  We talked about treating his allergies consistently at these times.  If he has persistent cough he will call and we will troubleshoot.

## 2023-03-14 NOTE — Progress Notes (Signed)
Subjective:    Patient ID: Daniel Dunn, male    DOB: 14-Jun-1941, 82 y.o.   MRN: [id number]  HPI  ROV 10/19/21 --follow-up visit for 82 year old never smoker for chronic cough and tree-in-bud abnormalities anterior lingula, right upper lobe, left upper lobe.  He was last seen here in December and was having flaring of his coughing at that time, possibly associated with increased nasal congestion and drainage.  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  He was continued on Zyrtec, added back fluticasone nasal spray.  Sputum cultures were obtained and repeat CT chest was done as below.  He was treated with azithromycin and reports that his cough is better. He does sometimes get sx when the temp is cold. He is on zyrtec daily, now using nasal steroid prn. No GERD sx. The most recent episode appears to be proceeded by a URI. He is undergoing cardiac monitoring for A Fib currently - had an episode pre-syncope.   CT chest 08/22/2021 reviewed by me showed stable right upper lobe nodular opacities 9 mm, 5 mm.  Stable 7 mm right upper lobe nodule.  Stable 4 mm left upper lobe nodule, numerous scattered additional micronodules.  The tree-in-bud nodularity previously noted in the inferior lingula was resolved and other areas of subpleural reticulation were improved to stable.  AFB 08/03/2021 is final and was negative.  Respiratory culture 07/22/2021 was inadequate for eval  Rov 03/14/23 --82 year old gentleman, never smoker with a history of an abnormal CT scan of the chest characterized by tree-in-bud abnormalities in the lingula, right upper lobe, left upper lobe.  Suspicious for [facility] although AFB sputum negative.  Also with chronic cough.  Follows with Dr. Antoine Poche with cardiology, atrial fibrillation, history of syncope. Today he reports that had COVID again beginning of 2024, had had some URI's since. He has cough at these times and during allergy season. He is on zyrtec prn. Minimal mucous  production. Good activity tolerance, still able to do chores outside. He had orthostasis and ? near syncope at church about 5 weeks ago - was dizzy.   Most recent CT chest 08/22/2021 as above.  Review of Systems As per HPI   Past Medical History:  Diagnosis Date   BPH    Dyslipidemia   Hyperlipidemia Adenomatous polyps of the colon Allergic rhinitis, vitamin D deficiency Aortic atherosclerosis   Family History  Problem Relation Age of Onset   Alzheimer's disease Father      Social History   Socioeconomic History   Marital status: Married    Spouse name: Not on file   Number of children: Not on file   Years of education: Not on file   Highest education level: Not on file  Occupational History   Not on file  Tobacco Use   Smoking status: Never   Smokeless tobacco: Never  Substance and Sexual Activity   Alcohol use: No   Drug use: No   Sexual activity: Not on file  Other Topics Concern   Not on file  Social History Narrative   Married.     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  Was in [place], some agent orange.  Has  lived in [place], [place], [place] Worked for [employer]  No Known Allergies   Outpatient Medications Prior to Visit  Medication Sig Dispense Refill   apixaban (ELIQUIS) 5 MG TABS tablet TAKE 1 TABLET BY MOUTH TWICE A DAY 180 tablet 1   atorvastatin (LIPITOR) 20 MG tablet Take 20 mg by mouth daily.     augmented betamethasone dipropionate (DIPROLENE-AF) 0.05 % cream Apply topically.     cetirizine (ZYRTEC) 10 MG tablet Take 10 mg by mouth as needed.     Cholecalciferol (VITAMIN D3) 50 MCG (2000 UT) CAPS Take 2 capsules by mouth daily.     tamsulosin (FLOMAX) 0.4 MG CAPS capsule Take  0.4 mg by mouth.     ibuprofen (ADVIL) 600 MG tablet Take 600 mg by mouth every 8 (eight) hours as needed. (Patient not taking: Reported on 12/14/2022)     No facility-administered medications prior to visit.         Objective:   Physical Exam Vitals:   03/14/23 0830  BP: (!) 92/58  Pulse: 84  Resp: 16  Temp: 97.6 F (36.4 C)  TempSrc: Oral  SpO2: 98%  Weight: 186 lb 9.6 oz (84.6 kg)  Height: 5\' 11"  (1.803 m)   Gen: Pleasant, well-nourished, in no distress,  normal affect  ENT: No lesions,  mouth clear,  oropharynx clear, no postnasal drip  Neck: No JVD, no stridor  Lungs: No use of accessory muscles, no crackles or wheezing on normal respiration, no wheeze on forced expiration  Cardiovascular: RRR, heart sounds normal, no murmur or gallops, no peripheral edema  Musculoskeletal: No deformities, no cyanosis or clubbing  Neuro: alert, awake, non focal  Skin: Warm, no lesions or rash      Assessment & Plan:  Abnormal CT of the chest Given its improvement, minimal bronchiectasis and his overall clinical stability do not believe we need to reimage at this time.  We would do so if he develops cough that persists, dyspnea or constitutional symptoms.  Chronic cough Not active at this time.  He has had some flares with viral infections and also a slight increase in cough burden during the spring and fall.  We talked about treating his allergies consistently at these times.  If he has persistent cough he will call and we will troubleshoot.    Levy Pupa, MD, PhD 03/14/2023, 8:45 AM [facility] [phone] or if no answer [phone]

## 2023-03-14 NOTE — Assessment & Plan Note (Signed)
Given its improvement, minimal bronchiectasis and his overall clinical stability do not believe we need to reimage at this time.  We would do so if he develops cough that persists, dyspnea or constitutional symptoms.

## 2023-03-14 NOTE — Patient Instructions (Signed)
Continue to use your Zyrtec as needed for allergy symptoms.  You should probably start taking it daily during the allergy seasons, spring and fall. Okay to use Delsym over-the-counter cough syrup for cough suppression if needed Please call our office if you have cough that persists for over 3 to 4 weeks so we can troubleshoot. Follow Dr. Delton Coombes as needed

## 2023-04-30 ENCOUNTER — Other Ambulatory Visit: Payer: Self-pay

## 2023-04-30 ENCOUNTER — Telehealth: Payer: Self-pay | Admitting: Cardiology

## 2023-04-30 ENCOUNTER — Other Ambulatory Visit: Payer: Self-pay | Admitting: Cardiology

## 2023-04-30 MED ORDER — APIXABAN 5 MG PO TABS: 5.0000 mg | ORAL_TABLET | Freq: Two times a day (BID) | ORAL | 1 refills | Status: DC

## 2023-04-30 NOTE — Telephone Encounter (Signed)
Prescription refill request for Eliquis received. Indication:AFIB Last office visit:5/24 Scr:0.87  5/24 Age: 82 Weight:84.6  kg  Prescription refilled

## 2023-04-30 NOTE — Telephone Encounter (Signed)
*  STAT* If patient is at the pharmacy, call can be transferred to refill team.   1. Which medications need to be refilled? (please list name of each medication and dose if known) apixaban (ELIQUIS) 5 MG TABS tablet  2. Which pharmacy/location (including street and city if local pharmacy) is medication to be sent to? [id number] - [place], [place] - [address]   3. Do they need a 30 day or 90 day supply?   90 day supply  Patient's wife states the patient is completely out of medication.

## 2023-04-30 NOTE — Telephone Encounter (Signed)
Prescription refill request for Eliquis received. Indication: PAF Last office visit: 12/14/22  Daiva Nakayama MD Scr: 0.87 on 12/28/22 Age: 82 Weight: 83.4kg  Based on above findings Eliquis 5mg  twice daily is the appropriate dose.  Refill approved.

## 2023-05-21 ENCOUNTER — Encounter: Payer: Self-pay | Admitting: Emergency Medicine

## 2023-05-21 ENCOUNTER — Telehealth: Payer: Self-pay | Admitting: Emergency Medicine

## 2023-05-21 ENCOUNTER — Encounter: Payer: Self-pay | Admitting: Cardiology

## 2023-05-21 NOTE — Telephone Encounter (Signed)
PT states husband has had a bad cough for awhile and she needs some direction as to how much Musinex to take and if we can RX something to help with the cough. It is keeping him up. Negative covid test.  [facility] [address]   Dr. Delton Coombes said to call us, not PCP, if he has these spells.   Her # is [phone] or [phone]

## 2023-05-21 NOTE — Telephone Encounter (Signed)
Lm x1 for patient's spouse, Karen(DPR).

## 2023-05-21 NOTE — Telephone Encounter (Signed)
Please try again. Thanks. [phone] is best  Her MYCHART message states:   This is Rajat Paulino's wife. We need a cough medicine called in for Eban. He is coughing continually from 11 PM until mid morning. Someone had mentioned some small pearl size capsules that would be helpful? I think the office has been trying to call his phone and something is wrong with his phone unfortunately. Please call me, Amdrew Mcrobie at [phone].

## 2023-05-21 NOTE — Telephone Encounter (Signed)
Lm for patient's spouse, Karen(DPR).

## 2023-05-21 NOTE — Telephone Encounter (Signed)
Patient's wife is trying to return missed call.

## 2023-05-21 NOTE — Telephone Encounter (Signed)
Lm for Karen(DPR) at number provided.

## 2023-05-23 MED ORDER — BENZONATATE 200 MG PO CAPS: 200.0000 mg | ORAL_CAPSULE | Freq: Three times a day (TID) | ORAL | 1 refills | Status: DC | PRN

## 2023-05-23 NOTE — Telephone Encounter (Signed)
This was handled in phone note dated 05/21/23  Closing this encounter

## 2023-05-23 NOTE — Telephone Encounter (Signed)
Agree.  I sent the prescription to his pharmacy.

## 2023-05-23 NOTE — Telephone Encounter (Signed)
I called and spoke with the pt and his spouse and notified of response per Dr Delton Coombes  Nothing further needed

## 2023-05-23 NOTE — Telephone Encounter (Signed)
Called and spoke with the pt's spouse  She states that the pt's cough continues to be an issue  He is coughing mainly at night, non prod  She is asking if he can try rx for tessalon  There is no SOB, fever, aches, hemoptysis  Please advise, thanks!  No Known Allergies

## 2023-05-24 ENCOUNTER — Telehealth: Payer: Self-pay | Admitting: Emergency Medicine

## 2023-05-24 MED ORDER — LEVOFLOXACIN 500 MG PO TABS: 500.0000 mg | ORAL_TABLET | Freq: Every day | ORAL | 0 refills | Status: DC

## 2023-05-24 NOTE — Telephone Encounter (Signed)
PT's wife calling again, He has had 2 Tesslaon pearls yet his mucus has gotten thicker. Cough seems to be worse at night. Please call to advise @ [phone]. There are other encounters but this one is different.

## 2023-05-24 NOTE — Telephone Encounter (Signed)
Yes I agree.  I sent a prescription for levofloxacin to his pharmacy.  Would be reasonable to ensure that he has follow-up with APP or RB in the next few weeks to ensure that he is improving

## 2023-05-24 NOTE — Telephone Encounter (Signed)
Spoke with the pt's spouse  She states that he has started to produce some sputum overnight- yellow in color and thick  She does not feel like the tessalon helped  He denies fever   No wheezing and overall his breathing is doing well  She states that it's been a while since he has had abx and she wonders if he needs one now with discolored sputum  Please advise, thanks!  No Known Allergies

## 2023-05-24 NOTE — Telephone Encounter (Signed)
I called and spoke with the pt's spouse and notified of response per Dr. Delton Coombes  She verbalized understanding  Nothing further needed

## 2023-07-03 LAB — LAB REPORT - SCANNED: EGFR: 88

## 2023-07-17 NOTE — Progress Notes (Unsigned)
@Patient  ID: Daniel Dunn, male    DOB: [birth year], 82 y.o.   MRN: [id number]  No chief complaint on file.   Referring provider: Merri Brunette, MD  HPI: 82 year old male, never smoked. PMH significant abnormal CT chest, chronic cough. Patient of Dr. Delton Coombes, last seen for consult on 08/09/20 for pulmonary nodule.   Previous [facility] pulmonary encounter:  03/14/23- Dr. Delton Coombes  Abnormal CT of the chest Given its improvement, minimal bronchiectasis and his overall clinical stability do not believe we need to reimage at this time.  We would do so if he develops cough that persists, dyspnea or constitutional symptoms.   Chronic cough Not active at this time.  He has had some flares with viral infections and also a slight increase in cough burden during the spring and fall.  We talked about treating his allergies consistently at these times.  If he has persistent cough he will call and we will troubleshoot.  Continue to use your Zyrtec as needed for allergy symptoms.  You should probably start taking it daily during the allergy seasons, spring and fall. Okay to use Delsym over-the-counter cough syrup for cough suppression if needed Please call our office if you have cough that persists for over 3 to 4 weeks so we can troubleshoot. Follow Dr. Delton Coombes as needed        07/18/2023 Patient presents today for 3-6 month follow-up.          No Known Allergies  Immunization History  Administered Date(s) Administered   Influenza, Quadrivalent, Recombinant, Inj, Pf 06/04/2019, 06/30/2020   Influenza-Unspecified 04/30/2001   PFIZER(Purple Top)SARS-COV-2 Vaccination 08/22/2019, 09/12/2019, 05/31/2020    Past Medical History:  Diagnosis Date   BPH    Dyslipidemia     Tobacco History: Social History   Tobacco Use  Smoking Status Never  Smokeless Tobacco Never   Counseling given: Not Answered   Outpatient Medications Prior to Visit  Medication Sig Dispense Refill   apixaban  (ELIQUIS) 5 MG TABS tablet Take 1 tablet (5 mg total) by mouth 2 (two) times daily. 180 tablet 1   atorvastatin (LIPITOR) 20 MG tablet Take 20 mg by mouth daily.     augmented betamethasone dipropionate (DIPROLENE-AF) 0.05 % cream Apply topically.     benzonatate (TESSALON) 200 MG capsule Take 1 capsule (200 mg total) by mouth 3 (three) times daily as needed for cough. 60 capsule 1   cetirizine (ZYRTEC) 10 MG tablet Take 10 mg by mouth as needed.     Cholecalciferol (VITAMIN D3) 50 MCG (2000 UT) CAPS Take 2 capsules by mouth daily.     levofloxacin (LEVAQUIN) 500 MG tablet Take 1 tablet (500 mg total) by mouth daily. 7 tablet 0   tamsulosin (FLOMAX) 0.4 MG CAPS capsule Take 0.4 mg by mouth.     No facility-administered medications prior to visit.      Review of Systems  Review of Systems   Physical Exam  There were no vitals taken for this visit. Physical Exam   Lab Results:  CBC    Component Value Date/Time   WBC 7.0 10/12/2021 0000   WBC 7.0 06/29/2021 0818   RBC 4.92 10/12/2021 0000   RBC 5.35 06/29/2021 0818   HGB 15.2 10/12/2021 0000   HCT 44.1 10/12/2021 0000   PLT 265 10/12/2021 0000   MCV 90 10/12/2021 0000   MCH 30.9 10/12/2021 0000   MCH 30.5 06/29/2021 0818   MCHC 34.5 10/12/2021 0000   MCHC 32.9  06/29/2021 0818   RDW 13.4 10/12/2021 0000   LYMPHSABS 1.0 06/29/2021 0818   MONOABS 1.0 06/29/2021 0818   EOSABS 0.3 06/29/2021 0818   BASOSABS 0.0 06/29/2021 0818    BMET    Component Value Date/Time   NA 137 10/12/2021 0000   K 4.1 10/12/2021 0000   CL 99 10/12/2021 0000   CO2 26 10/12/2021 0000   GLUCOSE 81 10/12/2021 0000   GLUCOSE 108 (H) 06/29/2021 0818   BUN 12 10/12/2021 0000   CREATININE 0.83 10/12/2021 0000   CALCIUM 9.1 10/12/2021 0000   GFRNONAA >60 06/29/2021 0818    BNP No results found for: "BNP"  ProBNP No results found for: "PROBNP"  Imaging: No results found.   Assessment & Plan:   No problem-specific Assessment & Plan  notes found for this encounter.     Glenford Bayley, NP 07/17/2023

## 2023-07-18 ENCOUNTER — Ambulatory Visit: Payer: 59 | Admitting: Primary Care

## 2023-07-18 ENCOUNTER — Encounter: Payer: Self-pay | Admitting: Primary Care

## 2023-07-18 ENCOUNTER — Ambulatory Visit: Payer: 59

## 2023-07-18 VITALS — BP 122/78 | HR 88 | Temp 97.1°F | Ht 71.0 in | Wt 188.4 lb

## 2023-07-18 DIAGNOSIS — Z23 Encounter for immunization: Secondary | ICD-10-CM

## 2023-07-18 DIAGNOSIS — R053 Chronic cough: Secondary | ICD-10-CM

## 2023-07-18 MED ORDER — AZELASTINE HCL 0.1 % NA SOLN: 2.0000 | Freq: Two times a day (BID) | NASAL | 2 refills | Status: DC

## 2023-07-18 MED ORDER — BENZONATATE 200 MG PO CAPS: 200.0000 mg | ORAL_CAPSULE | Freq: Three times a day (TID) | ORAL | 1 refills | Status: DC | PRN

## 2023-07-18 NOTE — Patient Instructions (Addendum)
-  CHRONIC COUGH: Chronic cough is a long-lasting cough that can be caused by various factors, including viral infections and allergies. Your cough has improved since October, but a recent viral infection caused a mild flare-up. Continue taking Zyrtec daily for allergies, use Tessalon Perles for cough relief as needed, and start using a nasal spray daily for postnasal drip. A chest x-ray has been ordered to rule out other causes, and re-imaging may be considered if symptoms worsen.  -POSTNASAL DRIP: Postnasal drip occurs when excess mucus from the nose drips down the back of the throat, which can contribute to a chronic cough. You are experiencing symptoms like watery eyes and a sore throat. To manage this, start using a nasal spray daily and continue taking Zyrtec daily.  Follow-up:  Please schedule follow up with Dr. Delton Coombes in August

## 2023-07-18 NOTE — Addendum Note (Signed)
Addended by: Gay Filler T on: 07/18/2023 10:16 AM   Modules accepted: Orders

## 2023-07-31 NOTE — Progress Notes (Signed)
I sent mychart message but can you call patient this week and let him know that his CXR showed no acute findings, coarsened interstitial lung markings without  evidence of PNA. If cough is persistent or no better would get updated CT imaging of your chest, please let us know

## 2023-10-12 ENCOUNTER — Other Ambulatory Visit: Payer: Self-pay | Admitting: Primary Care

## 2023-10-16 ENCOUNTER — Other Ambulatory Visit: Payer: Self-pay | Admitting: Cardiology

## 2023-10-16 DIAGNOSIS — I480 Paroxysmal atrial fibrillation: Secondary | ICD-10-CM

## 2023-10-16 NOTE — Telephone Encounter (Signed)
 Eliquis 5mg  refill request received. Patient is 83 years old, weight-85.5kg, Crea-0.85 on 07/02/23 via [employer] tab from [facility] [employer], [place], and last seen by Dr. Antoine Poche on 12/14/22. Dose is appropriate based on dosing criteria. Will send in refill to requested pharmacy.

## 2023-11-03 IMAGING — CT CT CHEST W/O CM
2 of 4 series · 15 of 36 positions shown, 18 images · non-contrast
Comparison: 07/12/2020

CLINICAL DATA: Chronic cough.  Pulmonary nodule.



[Series 2: thorax · axial · 0.76mm/px · z∈[-318,-40]mm · 12 of 165 slices shown, 15 images]
[im 13/165  mediastinal]
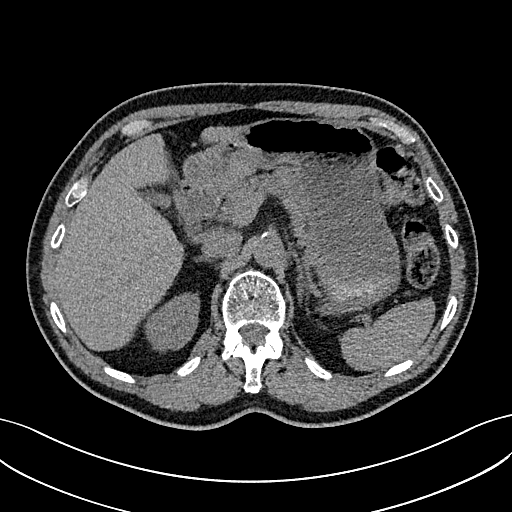
[im 13/165  lung]
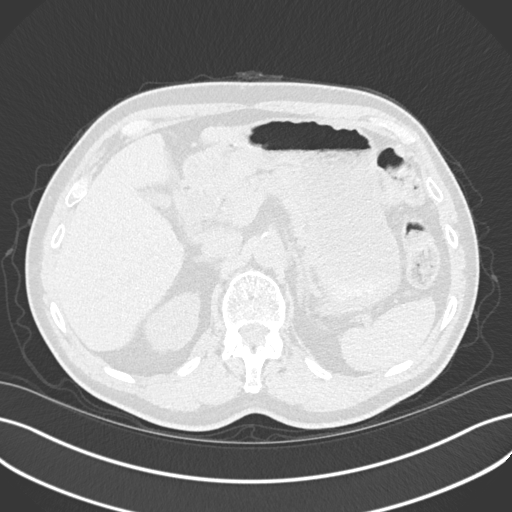
[im 26/165  lung]
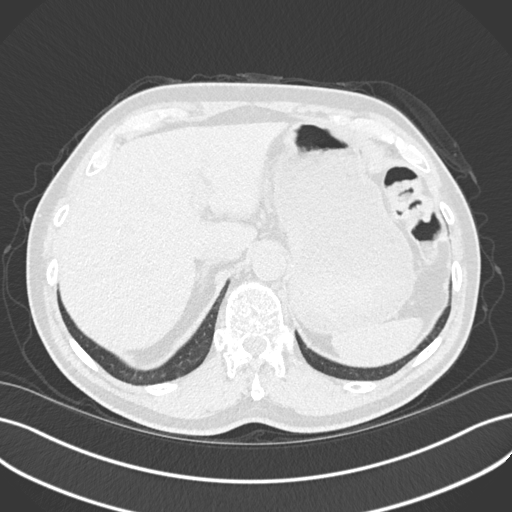
[im 38/165  lung]
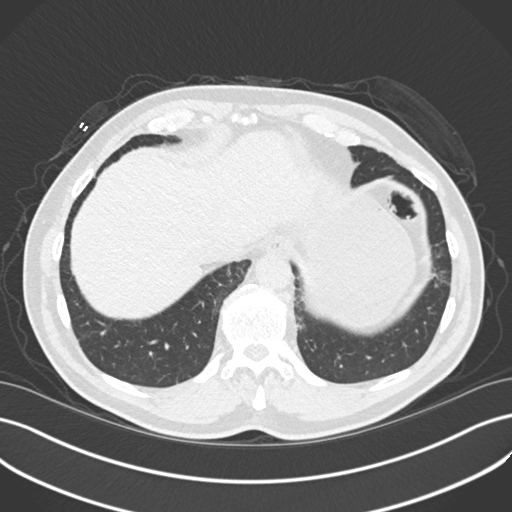
[im 51/165  lung]
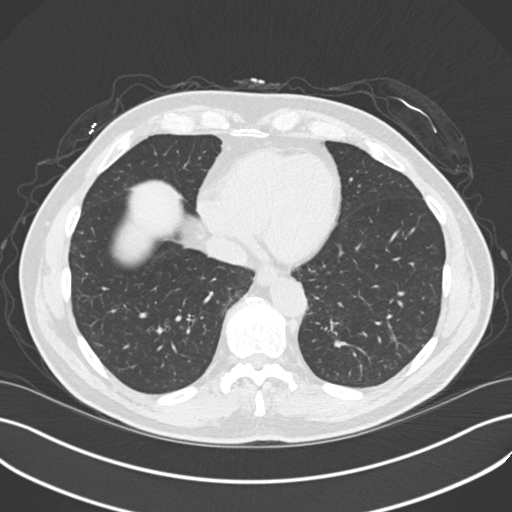
[im 64/165  mediastinal]
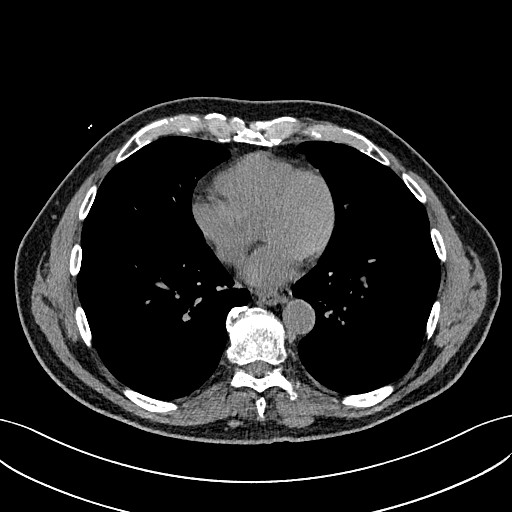
[im 64/165  lung]
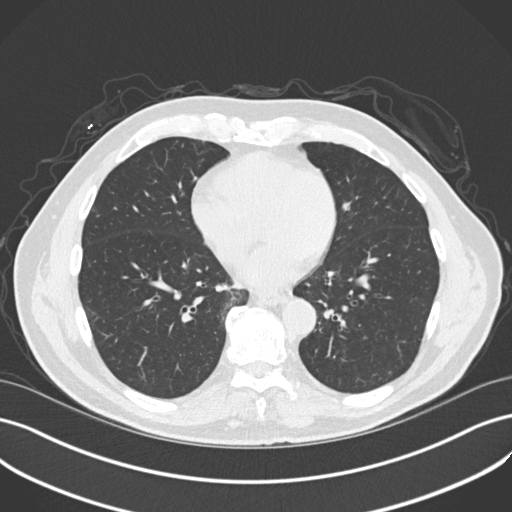
[im 76/165  lung]
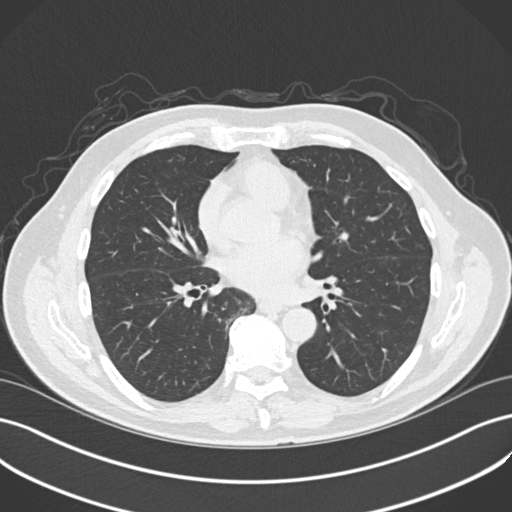
[im 89/165  lung]
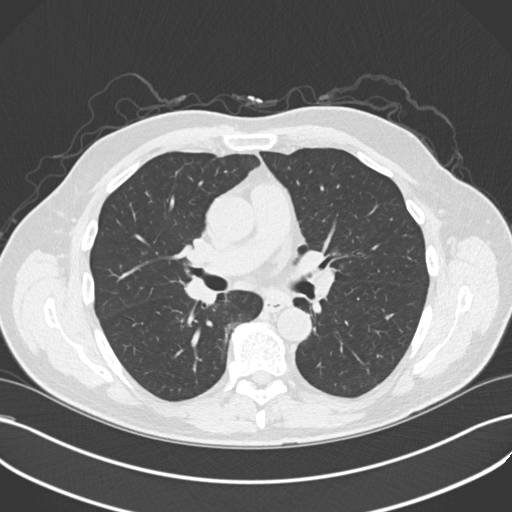
[im 101/165  lung]
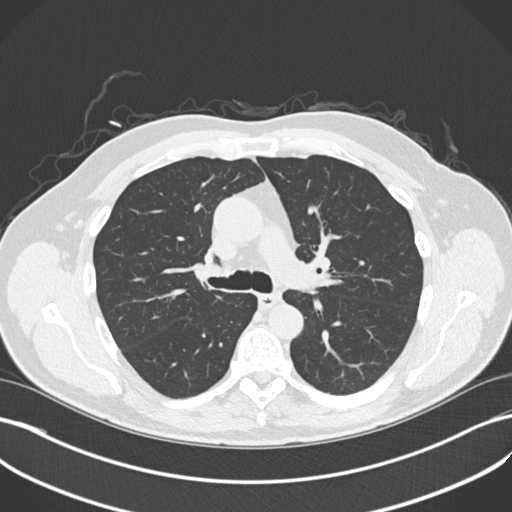
[im 114/165  mediastinal]
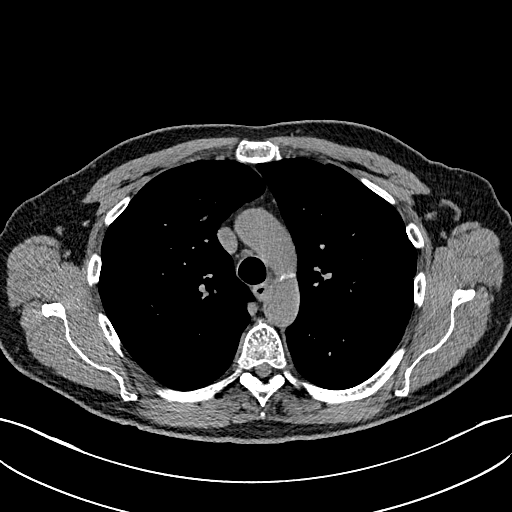
[im 114/165  lung]
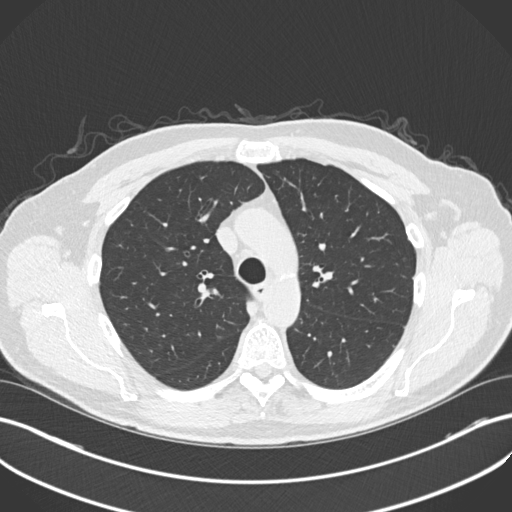
[im 127/165  lung]
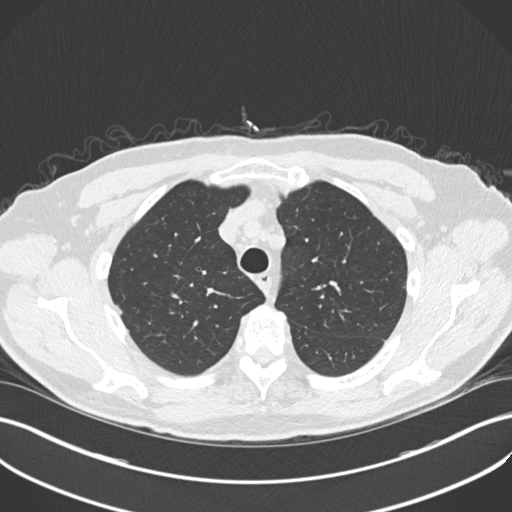
[im 139/165  lung]
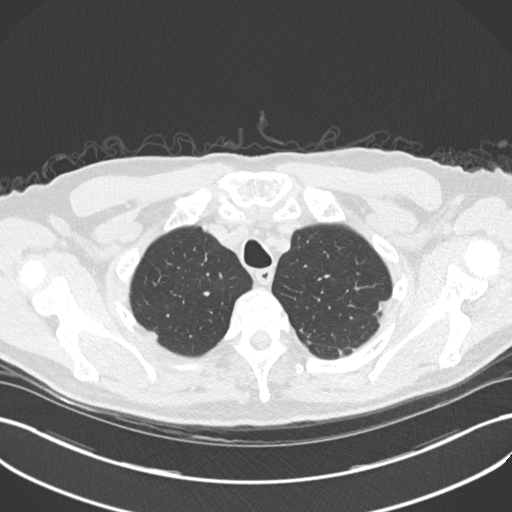
[im 152/165  lung]
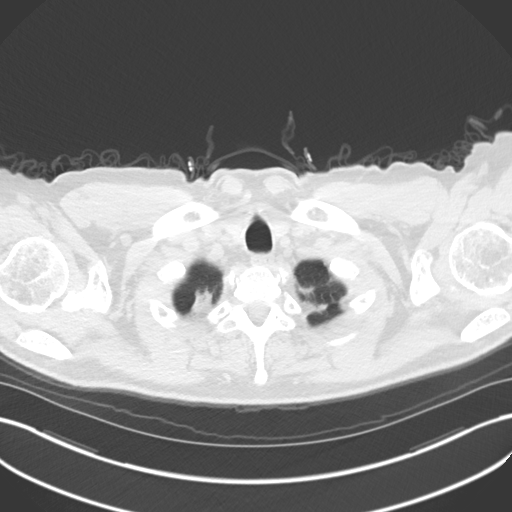

[Series 5: coronal · coronal · 0.64mm/px · 3 of 123 slices shown]
[im 25/123  lung]
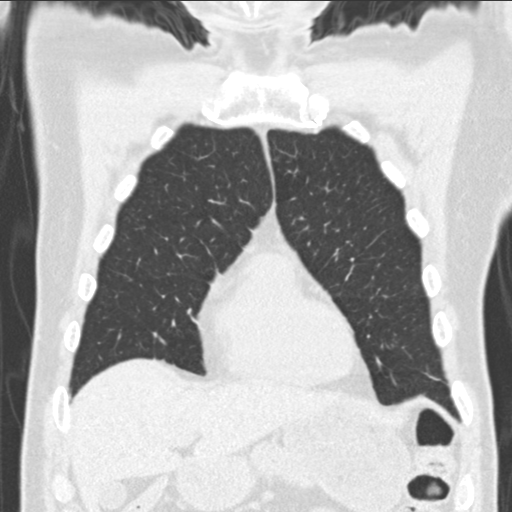
[im 49/123  lung]
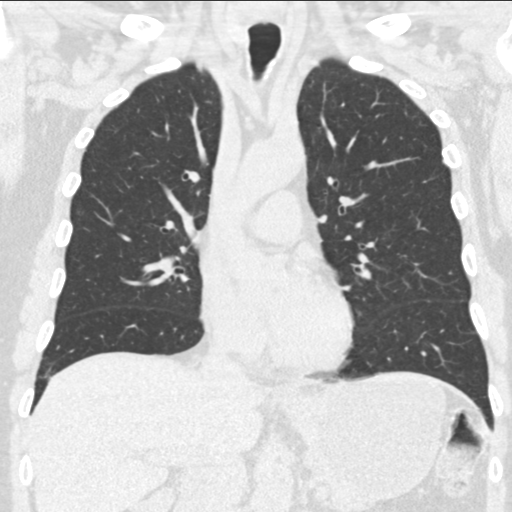
[im 74/123  lung]
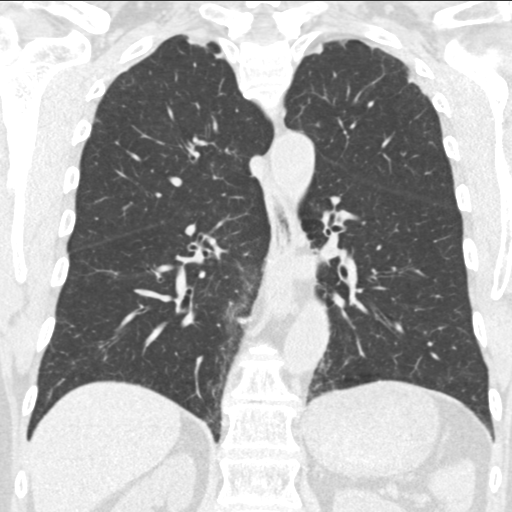

[15 of 36 positions shown; findings below may reference images not displayed]

FINDINGS: Cardiovascular: No significant vascular findings. Normal heart size.
No pericardial effusion.

Mediastinum/Nodes: Shotty mediastinal adenopathy within the
prevascular and aortopulmonary window lymph node groups is stable.
No frankly pathologic thoracic adenopathy identified. The esophagus
is unremarkable.

Lungs/Pleura:

Right upper lobe nodular densities, axial image # 21 and # [DATE] are
stable measuring 9 x 4 mm and 4 x 5 mm, respectively.

7 mm nodule, right upper lobe, axial image # 29, stable.

Triangular 4 mm nodule within the left upper lobe, axial image #
[DATE], is stable.

Numerous additional scattered 2-4 mm nodules are unchanged. No new
focal pulmonary nodules are identified. Tree-in-bud nodularity noted
within the inferior lingula on prior examination has resolved.

Scattered areas of subpleural reticulation noted on prior
examination have improved, by example within the right costophrenic
angle laterally. However, other regions appear relatively stable as
can be seen within the left paramediastinal region on axial image #
121 and have progressed in several regions, by example the right
paramediastinal region on axial image # 125 and within the left
costophrenic angle at axial image # 128. These findings are
nonspecific, but may reflect an underlying inflammatory process, as
can be seen with NSIP, may reflect changes of subacute aspiration or
smoking related lung disease. Ground-glass opacity noted within the
right cardiophrenic angle at the right lung base has resolved.

No pneumothorax or pleural effusion. Central airways are widely
patent.

Upper Abdomen: No acute abnormality.

Musculoskeletal: No acute bone abnormality. No lytic or blastic bone
lesion.
IMPRESSION: Stable nodular densities, as outlined above, since remote prior
examination of 04/26/2020. Dominant nodule measures 7 mm within the
right upper lobe. Repeat CT at 12-18 months months (from today's
scan) is considered optional for low-risk patients, but is
recommended for high-risk patients. This recommendation follows the
consensus statement: Guidelines for Management of Incidental
Pulmonary Nodules Detected on CT Images: From the [HOSPITAL]

Waxing and waning tree-in-bud nodularity and subpleural reticulation
within the lung bases bilaterally suggestive of a evolving
inflammatory process as can be seen with nonspecific interstitial
pneumonia, recurrent aspiration, or smoking related lung disease.

## 2024-01-02 LAB — LAB REPORT - SCANNED: EGFR: 82

## 2024-05-18 NOTE — Progress Notes (Unsigned)
 Cardiology Office Note:    Date:  05/19/2024   ID:  Daniel Dunn, DOB 1940-08-25, MRN [id number]  PCP:  Clarice Nottingham, MD   [facility] Providers Cardiologist:  Lynwood Schilling, MD { Click to update primary MD,subspecialty MD or APP then REFRESH:1}    Referring MD: Clarice Nottingham, MD   Chief complaint: Annual follow-up of syncope     History of Present Illness:   Daniel Dunn is a 83 y.o. male with a hx of syncope, dizziness, paroxysmal atrial fibrillation on OAC, aortic atherosclerosis, dyslipidemia, and BPH who presents for follow-up related to dizziness/syncope.   He had a syncopal episode in November 2022. He was evaluated in the ED. He was thought to be dehydrated and was positive for COVID-19 at the time. Head CT demonstrated no acute abnormalities. Troponin was unremarkable. There was EMS suggestion of atrial fibrillation, however, this was not documented in the emergency room. He does have a history of paroxysmal atrial fibrillation, on Eliquis . He has a history of aortic atherosclerosis noted on prior CT. Echocardiogram in 09/2021 showed EF 60 to 65%, normal LV function, no RWMA, normal RV systolic function, no significant valvular abnormalities. Cardiac monitor in 10/2021 showed brief runs of SVT, no atrial fibrillation/flutter.   Most recent cardiology OV 12/14/2022 with Dr. Schilling episodic syncope believed to be secondary to orthostatic hypotension.  Recommended discussion with urologist regarding Flomax dosing, if orthostatic hypotension continued midodrine would be considered.  Patient presents to the clinic today with his wife, appears to be stable from a cardiovascular standpoint. He denies chest pain, palpitations, dyspnea, orthopnea, n, v,  dark/tarry/bloody stools, hematuria, syncope, edema, weight gain.  Reports occasional dizziness with positional changes when dehydrated, only occurs rarely.  Patient is in atrial fibrillation per EKG on arrival to clinic,  patient is cardiac unaware and asymptomatic currently.  He is struggling with a minor cold that has been ongoing now for a little over 2 weeks, no witnessed signs of URI during interview (cough, sneeze, rhinorrhea).  Patient is unsure of when he may have converted from sinus rhythm to atrial fibrillation.  Patient states his PCP has him taking 5-10 mg of atorvastatin daily, occasionally a lower dose if he develops leg cramps.  Patient requesting trial of different statin drug to see if he can better tolerate it.  ROS:   Please see the history of present illness.    All other systems reviewed and are negative.     Past Medical History:  Diagnosis Date   BPH    Dyslipidemia     Past Surgical History:  Procedure Laterality Date   APPENDECTOMY     TONSILLECTOMY      Current Medications: Current Meds  Medication Sig   augmented betamethasone dipropionate (DIPROLENE-AF) 0.05 % cream Apply topically.   Azelastine  HCl 137 MCG/SPRAY SOLN PLACE 2 SPRAYS INTO BOTH NOSTRILS 2 (TWO) TIMES DAILY. USE IN EACH NOSTRIL AS DIRECTED   benzonatate  (TESSALON ) 200 MG capsule Take 1 capsule (200 mg total) by mouth 3 (three) times daily as needed for cough.   cetirizine (ZYRTEC) 10 MG tablet Take 10 mg by mouth as needed.   Cholecalciferol (VITAMIN D3) 50 MCG (2000 UT) CAPS Take 2 capsules by mouth daily.   clobetasol (TEMOVATE) 0.05 % external solution Apply topically.   donepezil (ARICEPT) 10 MG tablet Take 10 mg by mouth at bedtime.   ELIQUIS  5 MG TABS tablet TAKE 1 TABLET BY MOUTH TWICE A DAY   MYRBETRIQ 25 MG  TB24 tablet Take 25 mg by mouth daily.   rosuvastatin (CRESTOR) 10 MG tablet Take 1 tablet (10 mg total) by mouth daily.   tamsulosin (FLOMAX) 0.4 MG CAPS capsule Take 0.4 mg by mouth.   [DISCONTINUED] atorvastatin (LIPITOR) 20 MG tablet Take 20 mg by mouth daily.     Allergies:   Patient has no known allergies.   Social History   Socioeconomic History   Marital status: Married    Spouse  name: Not on file   Number of children: Not on file   Years of education: Not on file   Highest education level: Not on file  Occupational History   Not on file  Tobacco Use   Smoking status: Never   Smokeless tobacco: Never  Substance and Sexual Activity   Alcohol use: No   Drug use: No   Sexual activity: Not on file  Other Topics Concern   Not on file  Social History Narrative   Married.     Social Drivers of Corporate investment banker Strain: Not on file  Food Insecurity: Not on file  Transportation Needs: Not on file  Physical Activity: Not on file  Stress: Not on file  Social Connections: Not on file     Family History: The patient's family history includes Alzheimer's disease in his father.  EKGs/Labs/Other Studies Reviewed:    The following studies were reviewed today:  EKG Interpretation Date/Time:  Monday May 19 2024 15:34:37 EDT Ventricular Rate:  87 PR Interval:    QRS Duration:  98 QT Interval:  358 QTC Calculation: 430 R Axis:   -37  Text Interpretation: Atrial fibrillation Left axis deviation Incomplete right bundle branch block Confirmed by Deette Revak [phone]) on 05/19/2024 4:14:49 PM    Recent Labs: No results found for requested labs within last 365 days.  Recent Lipid Panel No results found for: CHOL, TRIG, HDL, CHOLHDL, VLDL, LDLCALC, LDLDIRECT   Risk Assessment/Calculations:    CHA2DS2-VASc Score = 2  {Confirm score is correct.  If not, click here to update score.  REFRESH note.  :1} This indicates a 2.2% annual risk of stroke. The patient's score is based upon: CHF History: 0 HTN History: 0 Diabetes History: 0 Stroke History: 0 Vascular Disease History: 0 Age Score: 2 Gender Score: 0   {This patient has a significant risk of stroke if diagnosed with atrial fibrillation.  Please consider VKA or DOAC agent for anticoagulation if the bleeding risk is acceptable.   You can also use the SmartPhrase .[email or web address]  for documentation.   :[id number]}            Physical Exam:    VS:  BP 96/60 (BP Location: Left Arm, Patient Position: Sitting, Cuff Size: Normal)   Pulse 87   Ht 5' 11 (1.803 m)   SpO2 98%   BMI 26.28 kg/m        Wt Readings from Last 3 Encounters:  07/18/23 188 lb 6.4 oz (85.5 kg)  03/14/23 186 lb 9.6 oz (84.6 kg)  12/14/22 183 lb 12.8 oz (83.4 kg)     GEN:  Well nourished, well developed in no acute distress HEENT: Normal NECK:  No carotid bruits CARDIAC:  S1-S2 normal, RRR, no murmurs, rubs, gallops RESPIRATORY:  Clear to auscultation without rales, wheezing or rhonchi  MUSCULOSKELETAL: 1+ pitting edema bilaterally; No deformity  SKIN: Warm and dry NEUROLOGIC:  Alert and oriented x 3 PSYCHIATRIC:  Normal affect       Assessment & Plan  PAF (paroxysmal atrial fibrillation) (HCC) EKG: Atrial fibrillation, left axis deviation, incomplete right bundle branch block, 87 bpm Changed from previous EKG when in NSR Patient cardiac unaware, surprised to find he is in atrial fibrillation. Denies chest pain, shortness of breath, palpitations, dizziness, lightheadedness Observed bilateral +1 pitting edema Discussed options surrounding atrial fibrillation Patient states over the last month he has maybe missed 2 doses of his Eliquis , but he is unsure Discussed importance of consistent OAC administration Discussed possibility of DCCV versus antiarrhythmic drugs, ablation therapy, or remaining in A-fib if rate is controlled Discussed signs and symptoms of A-fib with RVR and when to go to the emergency department, no documented history of A-fib with RVR in the past Patient does not currently wish to proceed with DCCV, states they would rather go to the atrial fibrillation clinic and have a follow-up EKG checked at that time to see if he is remaining in A-fib to allow the more time to think about their options Discussed Kardia mobile app for monitoring of A-fib, as well as using a  pulse oximeter to monitor heart rate Will place referral to atrial fibrillation clinic Will place order for echo given mild lower extremity edema with recurrence of atrial fibrillation for undetermined length of time Continue Eliquis  5 mg daily given this is the correct dose based on his age (12), weight (85.5 kg), creatinine (0.83) Mixed hyperlipidemia Unable to retrieve labs from PCP Will request these Will order fasting lipid panel to be repeated at some point over the next week with LFTs Will switch patient from atorvastatin 10 mg daily to Crestor 10 mg daily to see if this is better tolerated  Aortic atherosclerosis Switching atorvastatin to Crestor 10 mg daily Ordering lipid panel Will continue to monitor   Follow-up in 3 months with general cardiology Follow-up with A-fib clinic at next available appointment       Medication Adjustments/Labs and Tests Ordered: Current medicines are reviewed at length with the patient today.  Concerns regarding medicines are outlined above.  Orders Placed This Encounter  Procedures   Lipid panel   Hepatic function panel   Amb Referral to AFIB Clinic   EKG 12-Lead   ECHOCARDIOGRAM COMPLETE   Meds ordered this encounter  Medications   rosuvastatin (CRESTOR) 10 MG tablet    Sig: Take 1 tablet (10 mg total) by mouth daily.    Dispense:  90 tablet    Refill:  3    Patient Instructions  Medication Instructions:  Stop Atorvastatin Start Crestor 10 mg daily  *If you need a refill on your cardiac medications before your next appointment, please call your pharmacy*  Lab Work: Fasting lipid panel & LFT in 6 weeks  Testing/Procedures: Your physician has requested that you have an echocardiogram. Echocardiography is a painless test that uses sound waves to create images of your heart. It provides your doctor with information about the size and shape of your heart and how well your heart's chambers and valves are working. This procedure  takes approximately one hour. There are no restrictions for this procedure. Please do NOT wear cologne, perfume, aftershave, or lotions (deodorant is allowed). Please arrive 15 minutes prior to your appointment time.  Please note: We ask at that you not bring children with you during ultrasound (echo/ vascular) testing. Due to room size and safety concerns, children are not allowed in the ultrasound rooms during exams. Our front office staff cannot provide observation of children in our lobby area while testing is  being conducted. An adult accompanying a patient to their appointment will only be allowed in the ultrasound room at the discretion of the ultrasound technician under special circumstances. We apologize for any inconvenience.   Follow-Up: At [facility], you and your health needs are our priority.  As part of our continuing mission to provide you with exceptional heart care, our providers are all part of one team.  This team includes your primary Cardiologist (physician) and Advanced Practice Providers or APPs (Physician Assistants and Nurse Practitioners) who all work together to provide you with the care you need, when you need it.  Your next appointment:   3 month(s)  Provider:   Lynwood Schilling, MD    We recommend signing up for the patient portal called MyChart.  Sign up information is provided on this After Visit Summary.  MyChart is used to connect with patients for Virtual Visits (Telemedicine).  Patients are able to view lab/test results, encounter notes, upcoming appointments, etc.  Non-urgent messages can be sent to your provider as well.   To learn more about what you can do with MyChart, go to [email or web address].   Other Instructions            Signed, Miriam FORBES Shams, NP  05/19/2024 7:12 PM    [facility]

## 2024-05-19 ENCOUNTER — Telehealth: Payer: Self-pay

## 2024-05-19 ENCOUNTER — Ambulatory Visit: Attending: Nurse Practitioner | Admitting: Emergency Medicine

## 2024-05-19 ENCOUNTER — Encounter: Payer: Self-pay | Admitting: Nurse Practitioner

## 2024-05-19 VITALS — BP 96/60 | HR 87 | Ht 71.0 in

## 2024-05-19 DIAGNOSIS — I700 Atherosclerosis of aorta: Secondary | ICD-10-CM

## 2024-05-19 DIAGNOSIS — E782 Mixed hyperlipidemia: Secondary | ICD-10-CM

## 2024-05-19 DIAGNOSIS — I480 Paroxysmal atrial fibrillation: Secondary | ICD-10-CM | POA: Diagnosis not present

## 2024-05-19 MED ORDER — ROSUVASTATIN CALCIUM 10 MG PO TABS: 10.0000 mg | ORAL_TABLET | Freq: Every day | ORAL | 3 refills | Status: AC

## 2024-05-19 NOTE — Assessment & Plan Note (Signed)
 EKG: Atrial fibrillation, left axis deviation, incomplete right bundle branch block, 87 bpm Changed from previous EKG when in NSR Patient cardiac unaware, surprised to find he is in atrial fibrillation. Denies chest pain, shortness of breath, palpitations, dizziness, lightheadedness Observed bilateral +1 pitting edema Discussed options surrounding atrial fibrillation Patient states over the last month he has maybe missed 2 doses of his Eliquis , but he is unsure Discussed importance of consistent OAC administration Discussed possibility of DCCV versus antiarrhythmic drugs, ablation therapy, or remaining in A-fib if rate is controlled Discussed signs and symptoms of A-fib with RVR and when to go to the emergency department, no documented history of A-fib with RVR in the past Patient does not currently wish to proceed with DCCV, states they would rather go to the atrial fibrillation clinic and have a follow-up EKG checked at that time to see if he is remaining in A-fib to allow the more time to think about their options Discussed Kardia mobile app for monitoring of A-fib, as well as using a pulse oximeter to monitor heart rate Will place referral to atrial fibrillation clinic Will place order for echo given mild lower extremity edema with recurrence of atrial fibrillation for undetermined length of time Continue Eliquis  5 mg daily given this is the correct dose based on his age (55), weight (85.5 kg), creatinine (0.83)

## 2024-05-19 NOTE — Patient Instructions (Signed)
 Medication Instructions:  Stop Atorvastatin Start Crestor 10 mg daily  *If you need a refill on your cardiac medications before your next appointment, please call your pharmacy*  Lab Work: Fasting lipid panel & LFT in 6 weeks  Testing/Procedures: Your physician has requested that you have an echocardiogram. Echocardiography is a painless test that uses sound waves to create images of your heart. It provides your doctor with information about the size and shape of your heart and how well your heart's chambers and valves are working. This procedure takes approximately one hour. There are no restrictions for this procedure. Please do NOT wear cologne, perfume, aftershave, or lotions (deodorant is allowed). Please arrive 15 minutes prior to your appointment time.  Please note: We ask at that you not bring children with you during ultrasound (echo/ vascular) testing. Due to room size and safety concerns, children are not allowed in the ultrasound rooms during exams. Our front office staff cannot provide observation of children in our lobby area while testing is being conducted. An adult accompanying a patient to their appointment will only be allowed in the ultrasound room at the discretion of the ultrasound technician under special circumstances. We apologize for any inconvenience.   Follow-Up: At [facility], you and your health needs are our priority.  As part of our continuing mission to provide you with exceptional heart care, our providers are all part of one team.  This team includes your primary Cardiologist (physician) and Advanced Practice Providers or APPs (Physician Assistants and Nurse Practitioners) who all work together to provide you with the care you need, when you need it.  Your next appointment:   3 month(s)  Provider:   Lynwood Schilling, MD    We recommend signing up for the patient portal called MyChart.  Sign up information is provided on this After Visit Summary.   MyChart is used to connect with patients for Virtual Visits (Telemedicine).  Patients are able to view lab/test results, encounter notes, upcoming appointments, etc.  Non-urgent messages can be sent to your provider as well.   To learn more about what you can do with MyChart, go to [email or web address].   Other Instructions

## 2024-05-19 NOTE — Assessment & Plan Note (Signed)
 Switching atorvastatin to Crestor 10 mg daily Ordering lipid panel Will continue to monitor

## 2024-05-19 NOTE — Telephone Encounter (Signed)
 Pt was seen today. Pt needs an echocardiogram, AFIB appointment and a 3 month ov. Please arrange.

## 2024-05-23 ENCOUNTER — Encounter (HOSPITAL_COMMUNITY): Payer: Self-pay

## 2024-06-06 ENCOUNTER — Encounter (HOSPITAL_COMMUNITY): Payer: Self-pay | Admitting: Physician Assistant

## 2024-06-06 ENCOUNTER — Ambulatory Visit (HOSPITAL_COMMUNITY)
Admission: RE | Admit: 2024-06-06 | Discharge: 2024-06-06 | Disposition: A | Discharge status: Home / Self Care | Source: Ambulatory Visit | Attending: Physician Assistant | Admitting: Physician Assistant

## 2024-06-06 VITALS — BP 104/64 | HR 87 | Ht 71.0 in | Wt 183.2 lb

## 2024-06-06 DIAGNOSIS — I700 Atherosclerosis of aorta: Secondary | ICD-10-CM

## 2024-06-06 DIAGNOSIS — I4819 Other persistent atrial fibrillation: Secondary | ICD-10-CM | POA: Diagnosis not present

## 2024-06-06 DIAGNOSIS — E782 Mixed hyperlipidemia: Secondary | ICD-10-CM

## 2024-06-06 DIAGNOSIS — I951 Orthostatic hypotension: Secondary | ICD-10-CM

## 2024-06-06 DIAGNOSIS — I4891 Unspecified atrial fibrillation: Secondary | ICD-10-CM

## 2024-06-06 DIAGNOSIS — D6859 Other primary thrombophilia: Secondary | ICD-10-CM | POA: Diagnosis not present

## 2024-06-06 NOTE — Progress Notes (Signed)
 Primary Care Physician: Clarice Nottingham, MD Primary Cardiologist: Lynwood Schilling, MD Electrophysiologist: None  Referring Physician: Dr. Schilling Denise JONELLE Daniel Dunn is a 83 y.o. male with a history of paroxysmal AF (on Eliquis ), syncope, HLD, aortic atherosclerosis, BPH who presents for follow up in the [facility].  The patient was initially diagnosed with atrial fibrillation in January 2023 after complaints of syncope.  He was started on Eliquis  5 mg twice daily for CHA2DS2-VASc score of 2.  He wore a 30-day monitor that showed no sustained atrial fibrillation with bradycardia and nonsustained SVT. He has a history of aortic atherosclerosis noted on prior CT. Echocardiogram in 09/2021 showed EF 60 to 65%, normal LV function, no RWMA, normal RV systolic function, no significant valvular abnormalities.  He was found to have episodic syncope related to hypotension and was advised to follow-up with his urologist regarding Flomax.  He was seen most recently on 05/19/2024 by Miriam Shams, NP for annual follow-up and was noted to be in atrial fibrillation.  He was unaware and asymptomatic and noted to have +1 bilateral pitting edema.  He reported missing possibly 2 doses of Eliquis  over the past month.  Discussion was made regarding DCCV versus antiarrhythmics and ablation therapy.  Patient wished not to proceed with DCCV at that time and was advised to follow-up in the AF clinic for discussion of options.  He was also encouraged to obtain a Kardia mobile app or pulse oximeter to monitor heart rate.  He is scheduled to have a repeat echo on 07/04/2024.  Patient presents today for follow up for atrial fibrillation.  Mr.Kendricks presents today with his wife for evaluation of atrial fibrillation. During today's visit he was in rate controlled atrial fibrillation and is completely asymptomatic.  He was euvolemic today with trace pitting edema bilaterally.  He denied any episodes of  presyncope and blood pressure was stable during today's visit.  We discussed treatment options such as DCCV medication or continuing conservative therapy with no intervention and remaining in permanent A-fib.  Through shared decision with him and his wife they decided to continue conservative therapy for now with possible pursuit of DCCV if LV function has reduced on upcoming echocardiogram.  Today, he denies symptoms of palpitations, chest pain, shortness of breath, orthopnea, PND, lower extremity edema, dizziness, presyncope, syncope, snoring, daytime somnolence, bleeding, or neurologic sequela. The patient is tolerating medications without difficulties and is otherwise without complaint today.    Atrial Fibrillation Risk Factors:  he does not have symptoms or diagnosis of sleep apnea. he does not have a history of rheumatic fever. he does not have a history of alcohol use. The patient does have a history of early familial atrial fibrillation or other arrhythmias.  Atrial Fibrillation Management history:  Previous antiarrhythmic drugs: None Previous cardioversions: None Previous ablations: None Anticoagulation history: Eliquis  5 mg BID   ROS- All systems are reviewed and negative except as per the HPI above.  Past Medical History:  Diagnosis Date   BPH    Dyslipidemia     Current Outpatient Medications  Medication Sig Dispense Refill   Azelastine  HCl 137 MCG/SPRAY SOLN PLACE 2 SPRAYS INTO BOTH NOSTRILS 2 (TWO) TIMES DAILY. USE IN EACH NOSTRIL AS DIRECTED 30 mL 9   cefUROXime (CEFTIN) 500 MG tablet Take 500 mg by mouth every 12 (twelve) hours.     Cholecalciferol (VITAMIN D3) 50 MCG (2000 UT) CAPS Take 2 capsules by mouth daily.  clobetasol (TEMOVATE) 0.05 % external solution Apply topically. (Patient taking differently: Apply topically as needed.)     donepezil (ARICEPT) 10 MG tablet Take 10 mg by mouth at bedtime.     ELIQUIS  5 MG TABS tablet TAKE 1 TABLET BY MOUTH TWICE A  DAY 180 tablet 1   Fexofenadine HCl (ALLEGRA PO) Take 1 tablet by mouth every morning.     MYRBETRIQ 25 MG TB24 tablet Take 25 mg by mouth daily.     rosuvastatin (CRESTOR) 10 MG tablet Take 1 tablet (10 mg total) by mouth daily. 90 tablet 3   Specialty Vitamins Products (NERVIVE NERVE RELIEF PO) Take 1 tablet by mouth every morning.     tamsulosin (FLOMAX) 0.4 MG CAPS capsule Take 0.4 mg by mouth. (Patient taking differently: Take 0.4 mg by mouth at bedtime.)     benzonatate  (TESSALON ) 200 MG capsule Take 1 capsule (200 mg total) by mouth 3 (three) times daily as needed for cough. (Patient not taking: Reported on 06/06/2024) 60 capsule 1   diphenhydrAMINE (BENADRYL ALLERGY) 25 MG tablet Take 12.5 mg by mouth as needed.     No current facility-administered medications for this encounter.    Physical Exam: BP 104/64   Pulse 87   Ht 5' 11 (1.803 m)   Wt 83.1 kg   BMI 25.55 kg/m   GEN: Well nourished, well developed in no acute distress NECK: No JVD; No carotid bruits CARDIAC: Irregularly irregular rate and rhythm, no murmurs, rubs, gallops RESPIRATORY:  Clear to auscultation without rales, wheezing or rhonchi  ABDOMEN: Soft, non-tender, non-distended EXTREMITIES: Trace bilateral lower extremity edema no deformity   Wt Readings from Last 3 Encounters:  06/06/24 83.1 kg  07/18/23 85.5 kg  03/14/23 84.6 kg     EKG today demonstrates  Atrial fibrillation with rate of 87 bpm with incomplete RBBB and left anterior fascicular block  Echo 10/12/2021 demonstrated  1. Left ventricular ejection fraction, by estimation, is 60 to 65%. The  left ventricle has normal function. The left ventricle has no regional  wall motion abnormalities. Left ventricular diastolic parameters were  normal. The average left ventricular  global longitudinal strain is -24.6 %. The global longitudinal strain is  normal.   2. Right ventricular systolic function is normal. The right ventricular  size is normal.  There is normal pulmonary artery systolic pressure.   3. The mitral valve is normal in structure. No evidence of mitral valve  regurgitation. No evidence of mitral stenosis.   4. The aortic valve is tricuspid. Aortic valve regurgitation is not  visualized. No aortic stenosis is present.   5. Aortic dilatation noted. There is mild dilatation of the aortic root,  measuring 37 mm. There is mild dilatation of the ascending aorta,  measuring 37 mm.   6. The inferior vena cava is normal in size with greater than 50%  respiratory variability, suggesting right atrial pressure of 3 mmHg.    CHA2DS2-VASc Score = 2  The patient's score is based upon: CHF History: 0 HTN History: 0 Diabetes History: 0 Stroke History: 0 Vascular Disease History: 0 Age Score: 2 Gender Score: 0       ASSESSMENT AND PLAN: Paroxysmal Atrial Fibrillation (ICD10:  I48.0) The patient's CHA2DS2-VASc score is 2, indicating a 2.2% annual risk of stroke.   - Patient currently in asymptomatic rate controlled atrial fibrillation  - Options discussed today regarding treatment of AF such as pursuing DCCV versus watchful waiting. Patient elected to continue watchful waiting with  the plan to pursue DCCV if LV function abnormal on upcoming 2D echo. - Current dose of Eliquis  5 mg twice daily appropriate based on stable hemoglobin and creatinine  Aortic atherosclerosis: - Continue current GDMT with Crestor 10 mg daily  Orthostatic Hypotension: - Stable today with no complaints or concerns today  Secondary Hypercoagulable State (ICD10:  D68.69) The patient is at significant risk for stroke/thromboembolism based upon his CHA2DS2-VASc Score of 2.  Continue Apixaban  (Eliquis ).    Follow up with Dr Lavona as scheduled. AF clinic as needed.   [facility] [address] [place], [place] [address] [phone]

## 2024-06-30 LAB — LIPID PANEL

## 2024-07-01 ENCOUNTER — Ambulatory Visit: Payer: Self-pay | Admitting: Emergency Medicine

## 2024-07-01 LAB — HEPATIC FUNCTION PANEL
ALT: 37 IU/L (ref 0–44)
AST: 39 IU/L (ref 0–40)
Albumin: 4.4 g/dL (ref 3.7–4.7)
Alkaline Phosphatase: 85 IU/L (ref 48–129)
Bilirubin Total: 0.6 mg/dL (ref 0.0–1.2)
Bilirubin, Direct: 0.23 mg/dL (ref 0.00–0.40)
Total Protein: 6.2 g/dL (ref 6.0–8.5)

## 2024-07-01 LAB — LIPID PANEL
Cholesterol, Total: 106 mg/dL (ref 100–199)
HDL: 59 mg/dL (ref >39)
LDL CALC COMMENT:: 1.8 ratio (ref 0.0–5.0)
LDL Chol Calc (NIH): 35 mg/dL (ref 0–99)
Triglycerides: 45 mg/dL (ref 0–149)
VLDL Cholesterol Cal: 12 mg/dL (ref 5–40)

## 2024-07-01 NOTE — Telephone Encounter (Signed)
-----   Message from Miriam FORBES Shams sent at 07/01/2024  8:10 AM EST ----- Cholesterol and liver function looks good. Continue meds as prescribed ----- Message ----- From: Interface, [facility] Lab Results In Sent: 06/30/2024  11:36 PM EST To: Kenzie E Campbell, NP

## 2024-07-01 NOTE — Telephone Encounter (Signed)
 Lvm of results and clinic number if any questions or concern

## 2024-07-04 ENCOUNTER — Ambulatory Visit (HOSPITAL_COMMUNITY): Admission: RE | Admit: 2024-07-04 | Discharge: 2024-07-04 | Attending: Emergency Medicine

## 2024-07-04 DIAGNOSIS — I480 Paroxysmal atrial fibrillation: Secondary | ICD-10-CM

## 2024-07-04 LAB — ECHOCARDIOGRAM COMPLETE
Area-P 1/2: 3.16 cm2
S' Lateral: 2.6 cm

## 2024-07-26 ENCOUNTER — Other Ambulatory Visit: Payer: Self-pay | Admitting: Cardiology

## 2024-07-26 DIAGNOSIS — I480 Paroxysmal atrial fibrillation: Secondary | ICD-10-CM

## 2024-07-28 NOTE — Telephone Encounter (Signed)
 Prescription refill request for Eliquis  received. Indication: A-Fib Last office visit: 05/19/24 Scr: 0.94 01/02/24 Care Everywhere Age: 83 Weight: 83.1 KG Pt has passed all Parameters. Dose Good.

## 2024-08-04 ENCOUNTER — Other Ambulatory Visit: Payer: Self-pay | Admitting: Primary Care

## 2024-08-07 ENCOUNTER — Ambulatory Visit (INDEPENDENT_AMBULATORY_CARE_PROVIDER_SITE_OTHER): Admitting: Emergency Medicine

## 2024-08-07 ENCOUNTER — Encounter: Payer: Self-pay | Admitting: Emergency Medicine

## 2024-08-07 VITALS — BP 106/60 | HR 74 | Ht 71.0 in | Wt 184.2 lb

## 2024-08-07 DIAGNOSIS — R053 Chronic cough: Secondary | ICD-10-CM | POA: Diagnosis not present

## 2024-08-07 MED ORDER — BENZONATATE 200 MG PO CAPS: 200.0000 mg | ORAL_CAPSULE | Freq: Three times a day (TID) | ORAL | 1 refills | Status: AC | PRN

## 2024-08-07 NOTE — Assessment & Plan Note (Signed)
 Acute on chronic cough.  This episode appears to be in the setting of a URI that he developed about 3 weeks ago but he does have history of cough and inflammatory change on CT.  He will finish the antibiotics that were started by his PCP and we will suppress his cough with benzonatate .  Follow-up in 1 month.  If he continues to have symptoms then would favor repeat CT scan of the chest to compare with 3 years ago, pulmonary function testing to evaluate for occult obstructive disease.

## 2024-08-07 NOTE — Patient Instructions (Signed)
 Complete your antibiotic prescription as ordered until completely gone We will give you prescription for benzonatate  to use for cough suppression Follow-up in our office in about 1 month.  If you continue to have cough at that time then we will plan to repeat your CT scan of the chest and perform pulmonary function testing to evaluate further.

## 2024-08-07 NOTE — Progress Notes (Signed)
 "  Subjective:    Patient ID: Daniel Dunn, male    DOB: 09-Sep-1940, 84 y.o.   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  I have followed him for this as well as chronic cough. He has A fib,  He is here today reporting cough that started about 3 weeks ago. He and his wife both had a URI. And his cough persisted, some thick gray mucous. No hemoptysis. He was started on ceftin, there has been some improvement. He has partial improvement on robitussin, needs tessalon .    Review of Systems  Respiratory:  Positive for cough.   He has noticed a raised spot on the roof of his mouth that was sore.   As per HPI   Past Medical History:  Diagnosis Date   BPH    Dyslipidemia   Hyperlipidemia Adenomatous polyps of the colon Allergic rhinitis, vitamin D deficiency Aortic atherosclerosis   Family History  Problem Relation Age of Onset   Alzheimer's disease Father      Social History   Socioeconomic History   Marital status: Married    Spouse name: Not on file   Number of children: Not on file   Years of education: Not on file   Highest education level: Not on file  Occupational History   Not on file  Tobacco Use   Smoking status: Never   Smokeless tobacco: Never  Substance and Sexual Activity   Alcohol use: No   Drug use: No   Sexual activity: Not on file  Other Topics Concern   Not on file  Social History Narrative   Married.     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  Was in [place], some agent orange.  Has lived in [place], [place], [place] Worked for [employer]  No Known Allergies   Outpatient Medications Prior to Visit  Medication Sig Dispense Refill   Azelastine  HCl 137 MCG/SPRAY SOLN PLACE 2 SPRAYS INTO BOTH NOSTRILS 2 (TWO) TIMES DAILY. USE IN EACH NOSTRIL AS DIRECTED 30 mL 9   cefUROXime (CEFTIN) 500 MG tablet Take 500 mg by mouth every 12 (twelve) hours.     Cholecalciferol (VITAMIN D3) 50 MCG (2000 UT) CAPS Take 2 capsules by mouth daily.     clobetasol (TEMOVATE) 0.05 % external solution Apply topically. (Patient taking differently: Apply topically as needed.)     diphenhydrAMINE (BENADRYL ALLERGY) 25 MG tablet Take 12.5 mg by mouth as needed.     donepezil (ARICEPT) 10 MG tablet Take 10 mg by mouth at bedtime.     ELIQUIS  5 MG TABS tablet TAKE 1 TABLET BY MOUTH TWICE A DAY 180 tablet 1   Fexofenadine HCl (ALLEGRA PO)  Take 1 tablet by mouth every morning.     guaifenesin (ROBITUSSIN) 100 MG/5ML syrup Take 200 mg by mouth 3 (three) times daily as needed for cough.     MYRBETRIQ 25 MG TB24 tablet Take 25 mg by mouth daily.     rosuvastatin  (CRESTOR ) 10 MG tablet Take 1 tablet (10 mg total) by mouth daily. 90 tablet 3   Specialty Vitamins Products (NERVIVE NERVE RELIEF PO) Take 1 tablet by mouth every morning.     benzonatate  (TESSALON ) 200 MG capsule Take 1 capsule (200 mg total) by mouth 3 (three) times daily as needed for cough. 60 capsule 1   tamsulosin (FLOMAX) 0.4 MG CAPS capsule Take 0.4 mg by mouth. (Patient not taking: Reported on 08/07/2024)     No facility-administered medications prior to visit.         Objective:   Physical Exam Vitals:   08/07/24 1328  BP: 106/60  Pulse: 74  SpO2: 98%  Weight: 184 lb 3.2 oz (83.6  kg)  Height: 5' 11 (1.803 m)   Gen: Pleasant, well-nourished, in no distress,  normal affect  ENT: No lesions,  mouth clear,  oropharynx clear, no postnasal drip  Neck: No JVD, no stridor  Lungs: No use of accessory muscles, no crackles or wheezing on normal respiration, no wheeze on forced expiration  Cardiovascular: RRR, heart sounds normal, no murmur or gallops, no peripheral edema  Musculoskeletal: No deformities, no cyanosis or clubbing  Neuro: alert, awake, non focal  Skin: Warm, no lesions or rash      Assessment & Plan:  Chronic cough Acute on chronic cough.  This episode appears to be in the setting of a URI that he developed about 3 weeks ago but he does have history of cough and inflammatory change on CT.  He will finish the antibiotics that were started by his PCP and we will suppress his cough with benzonatate .  Follow-up in 1 month.  If he continues to have symptoms then would favor repeat CT scan of the chest to compare with 3 years ago, pulmonary function testing to evaluate for occult obstructive disease.     Lamar Chris, MD, PhD 08/07/2024, 1:55 PM [facility] [phone] or if no answer [phone]  "

## 2024-08-11 ENCOUNTER — Other Ambulatory Visit: Payer: Self-pay

## 2024-08-11 DIAGNOSIS — H903 Sensorineural hearing loss, bilateral: Secondary | ICD-10-CM | POA: Insufficient documentation

## 2024-08-11 DIAGNOSIS — J309 Allergic rhinitis, unspecified: Secondary | ICD-10-CM | POA: Insufficient documentation

## 2024-08-11 DIAGNOSIS — Z860101 Personal history of adenomatous and serrated colon polyps: Secondary | ICD-10-CM | POA: Insufficient documentation

## 2024-08-11 DIAGNOSIS — H9190 Unspecified hearing loss, unspecified ear: Secondary | ICD-10-CM | POA: Insufficient documentation

## 2024-08-11 DIAGNOSIS — Z7901 Long term (current) use of anticoagulants: Secondary | ICD-10-CM | POA: Insufficient documentation

## 2024-08-11 DIAGNOSIS — L989 Disorder of the skin and subcutaneous tissue, unspecified: Secondary | ICD-10-CM | POA: Insufficient documentation

## 2024-08-11 DIAGNOSIS — N529 Male erectile dysfunction, unspecified: Secondary | ICD-10-CM | POA: Insufficient documentation

## 2024-08-11 DIAGNOSIS — E7800 Pure hypercholesterolemia, unspecified: Secondary | ICD-10-CM | POA: Insufficient documentation

## 2024-08-11 DIAGNOSIS — R413 Other amnesia: Secondary | ICD-10-CM | POA: Insufficient documentation

## 2024-08-11 DIAGNOSIS — R918 Other nonspecific abnormal finding of lung field: Secondary | ICD-10-CM | POA: Insufficient documentation

## 2024-08-11 DIAGNOSIS — E785 Hyperlipidemia, unspecified: Secondary | ICD-10-CM | POA: Insufficient documentation

## 2024-08-11 DIAGNOSIS — N400 Enlarged prostate without lower urinary tract symptoms: Secondary | ICD-10-CM | POA: Insufficient documentation

## 2024-08-11 DIAGNOSIS — N289 Disorder of kidney and ureter, unspecified: Secondary | ICD-10-CM | POA: Insufficient documentation

## 2024-08-11 DIAGNOSIS — R9389 Abnormal findings on diagnostic imaging of other specified body structures: Secondary | ICD-10-CM | POA: Insufficient documentation

## 2024-08-11 DIAGNOSIS — E559 Vitamin D deficiency, unspecified: Secondary | ICD-10-CM | POA: Insufficient documentation

## 2024-08-11 DIAGNOSIS — Z7729 Contact with and (suspected ) exposure to other hazardous substances: Secondary | ICD-10-CM | POA: Insufficient documentation

## 2024-08-21 DIAGNOSIS — I4819 Other persistent atrial fibrillation: Secondary | ICD-10-CM | POA: Insufficient documentation

## 2024-08-21 NOTE — Progress Notes (Unsigned)
 " Cardiology Office Note:   Date:  08/21/2024  ID:  Daniel Dunn, DOB 1940/09/02, MRN [id number] PCP: Clarice Nottingham, MD  [facility] Providers Cardiologist:  Lynwood Schilling, MD {  History of Present Illness:   Daniel Dunn is a 84 y.o. male who presents for evaluation of syncope.  The patient had a syncopal episode in late November 2022.  He was in the emergency room .  He was thought to be dehydrated and was found to be COVID-positive.  He was treated with hydration.  Head CT demonstrated no acute abnormalities.  Troponin was unremarkable.  There was a EMS suggestion of the patient was in atrial fibrillation but this was not documented in the emergency room.    He has had aortic atherosclerosis on a CT.  He had presyncope prior to the last visit.  Echo demonstrated no acute abnormalities.  He had no etiology identified on a monitor.   I was able to review the EKG from the primary care office and he was in fibrillation but his rate was 82.  He has been taking Eliquis .  He had not wanted DCCV.  After the last visit he had a normal echo.  LV function was normal.  He wanted to discuss further DCCV.  We have a very limited understanding despite aggressive attempts to try to describe this rhythm.  He does have probably decreased tolerance for doing physical activity that they both notice though it is difficult to quantify or qualify.  He is not feeling palpitations and does not have presyncope or syncope.  He has had some dizziness described.  He is not having any chest pressure, neck or arm discomfort.  He had no weight gain or edema.  He is tolerating anticoagulation.  Echo done recently demonstrated well-preserved ejection fraction with no significant valvular abnormalities.  ROS: As stated in the HPI and negative for all other systems.  Studies Reviewed:    EKG:         Risk Assessment/Calculations:    CHA2DS2-VASc Score = 2   This indicates a 2.2% annual risk of stroke. The  patient's score is based upon: CHF History: 0 HTN History: 0 Diabetes History: 0 Stroke History: 0 Vascular Disease History: 0 Age Score: 2 Gender Score: 0     Physical Exam:   VS:  There were no vitals taken for this visit.   Wt Readings from Last 3 Encounters:  08/07/24 184 lb 3.2 oz (83.6 kg)  06/06/24 183 lb 3.2 oz (83.1 kg)  07/18/23 188 lb 6.4 oz (85.5 kg)     GEN: Well nourished, well developed in no acute distress NECK: No JVD; No carotid bruits CARDIAC: Irregular RR, no murmurs, rubs, gallops RESPIRATORY:  Clear to auscultation without rales, wheezing or rhonchi  ABDOMEN: Soft, non-tender, non-distended EXTREMITIES:  No edema; No deformity   ASSESSMENT AND PLAN:   Syncope:   This was likely related to low blood pressure.  He has not had any further episodes.  He is off of Flomax which I thought might contribute.  He understands to avoid sudden changes in position and could consider compression stockings.  At this point I do not think he needs to be started on midodrine unless he is having presyncope or syncope which he is clearly not describing today.  Atrial fibrillation: He does have some increased fatigue.  We again had a very long discussion about this and I tried to give him a detailed description of atrial fibrillation.  They both noted him to have increasing fatigue and I cannot absolutely say that is not related to the fibrillation.  It seems to be persistent and chronic and DC cardioversion would be reasonable to see if he has any improvement.  He has missed a couple of doses of Eliquis  and I will schedule DCCV after he has been on 3 weeks of uninterrupted Eliquis .   Aortic atherosclerosis:   LDL was 35 with an HDL 59.  No change in therapy.  Follow up with me after the DCCV.   Signed, Lynwood Schilling, MD   "

## 2024-08-22 ENCOUNTER — Ambulatory Visit: Attending: Cardiology | Admitting: Cardiology

## 2024-08-22 ENCOUNTER — Encounter: Payer: Self-pay | Admitting: Cardiology

## 2024-08-22 VITALS — BP 92/52 | HR 79 | Ht 71.0 in | Wt 183.0 lb

## 2024-08-22 DIAGNOSIS — I700 Atherosclerosis of aorta: Secondary | ICD-10-CM | POA: Diagnosis not present

## 2024-08-22 DIAGNOSIS — I4819 Other persistent atrial fibrillation: Secondary | ICD-10-CM

## 2024-08-22 DIAGNOSIS — Z01812 Encounter for preprocedural laboratory examination: Secondary | ICD-10-CM | POA: Diagnosis not present

## 2024-08-22 DIAGNOSIS — R55 Syncope and collapse: Secondary | ICD-10-CM

## 2024-08-22 NOTE — Patient Instructions (Addendum)
 Medication Instructions:  Your physician recommends that you continue on your current medications as directed. Please refer to the Current Medication list given to you today.  *If you need a refill on your cardiac medications before your next appointment, please call your pharmacy*  Lab Work: CBC, BMET in February at [place] If you have labs (blood work) drawn today and your tests are completely normal, you will receive your results only by: MyChart Message (if you have MyChart) OR A paper copy in the mail If you have any lab test that is abnormal or we need to change your treatment, we will call you to review the results.  Testing/Procedures: Cardioversion  Follow-Up: At [facility], you and your health needs are our priority.  As part of our continuing mission to provide you with exceptional heart care, our providers are all part of one team.  This team includes your primary Cardiologist (physician) and Advanced Practice Providers or APPs (Physician Assistants and Nurse Practitioners) who all work together to provide you with the care you need, when you need it.  Your next appointment:   3 month(s) after Feb 23rd  Provider:   Lavona, MD  We recommend signing up for the patient portal called MyChart.  Sign up information is provided on this After Visit Summary.  MyChart is used to connect with patients for Virtual Visits (Telemedicine).  Patients are able to view lab/test results, encounter notes, upcoming appointments, etc.  Non-urgent messages can be sent to your provider as well.   To learn more about what you can do with MyChart, go to [email or web address].   Other Instructions      Dear Daniel Dunn  You are scheduled for a Cardioversion on Monday, February 23 with Dr. Mona.  Please arrive at the [facility] (Main Entrance A) at [facility]: [address] [place], [place] [address] at 9:00 AM (This time is 1 hour(s) before your procedure to ensure  your preparation).   Free valet parking service is available. You will check in at ADMITTING.   *Please Note: You will receive a call the day before your procedure to confirm the appointment time. That time may have changed from the original time based on the schedule for that day.*   DIET:  Nothing to eat or drink after midnight except a sip of water with medications (see medication instructions below)  MEDICATION INSTRUCTIONS: !!IF ANY NEW MEDICATIONS ARE STARTED AFTER TODAY, PLEASE NOTIFY YOUR PROVIDER AS SOON AS POSSIBLE!!  FYI: Medications such as Semaglutide (Ozempic, Wegovy), Tirzepatide (Mounjaro, Zepbound), Dulaglutide (Trulicity), etc (GLP1 agonists) AND Canagliflozin (Invokana), Dapagliflozin (Farxiga), Empagliflozin (Jardiance), Ertugliflozin (Steglatro), Bexagliflozin [employer]) or any combination with one of these drugs such as Invokamet (Canagliflozin/Metformin), Synjardy (Empagliflozin/Metformin), etc (SGLT2 inhibitors) must be held around the time of a procedure. This is not a comprehensive list of all of these drugs. Please review all of your medications and talk to your provider if you take any one of these. If you are not sure, ask your provider.    Continue taking your anticoagulant (blood thinner): Apixaban  (Eliquis ).  You will need to continue this after your procedure until you are told by your provider that it is safe to stop.    LABS: Labs in February  FYI:  For your safety, and to allow us  to monitor your vital signs accurately during the surgery/procedure we request: If you have artificial nails, gel coating, SNS etc, please have those removed prior to your surgery/procedure. Not having  the nail coverings /polish removed may result in cancellation or delay of your surgery/procedure.  Your support person will be asked to wait in the waiting room during your procedure.  It is OK to have someone drop you off and come back when you are ready to be discharged.  You  cannot drive after the procedure and will need someone to drive you home.  Bring your insurance cards.  *Special Note: Every effort is made to have your procedure done on time. Occasionally there are emergencies that occur at the hospital that may cause delays. Please be patient if a delay does occur.

## 2024-08-24 ENCOUNTER — Encounter: Payer: Self-pay | Admitting: Cardiology

## 2024-09-22 ENCOUNTER — Encounter (HOSPITAL_COMMUNITY): Payer: Self-pay

## 2024-09-22 ENCOUNTER — Ambulatory Visit (HOSPITAL_COMMUNITY): Admit: 2024-09-22 | Admitting: Internal Medicine

## 2024-10-15 ENCOUNTER — Ambulatory Visit: Admitting: Primary Care

## 2024-11-24 ENCOUNTER — Ambulatory Visit: Admitting: Cardiology
